# Patient Record
Sex: Female | Born: 2000
Health system: Southern US, Community
[De-identification: ages and names within clinical notes are randomized; demographics above are authoritative.]

## PROBLEM LIST (undated history)

## (undated) DIAGNOSIS — R51 Headache: Secondary | ICD-10-CM

## (undated) DIAGNOSIS — T7840XA Allergy, unspecified, initial encounter: Secondary | ICD-10-CM

## (undated) DIAGNOSIS — J189 Pneumonia, unspecified organism: Secondary | ICD-10-CM

## (undated) HISTORY — DX: Allergy, unspecified, initial encounter: T78.40XA

## (undated) HISTORY — DX: Headache: R51

## (undated) HISTORY — DX: Pneumonia, unspecified organism: J18.9

---
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
## 2020-01-04 ENCOUNTER — Other Ambulatory Visit: Payer: Self-pay | Admitting: Family

## 2020-01-04 DIAGNOSIS — Z975 Presence of (intrauterine) contraceptive device: Secondary | ICD-10-CM

## 2020-01-04 DIAGNOSIS — N921 Excessive and frequent menstruation with irregular cycle: Secondary | ICD-10-CM

## 2020-01-04 MED ORDER — NORETHINDRONE ACET-ETHINYL EST 1.5-30 MG-MCG PO TABS: 1.0000 | ORAL_TABLET | Freq: Every day | ORAL | 3 refills | Status: DC

## 2020-01-14 DIAGNOSIS — F4323 Adjustment disorder with mixed anxiety and depressed mood: Secondary | ICD-10-CM | POA: Diagnosis not present

## 2020-01-14 DIAGNOSIS — F4322 Adjustment disorder with anxiety: Secondary | ICD-10-CM | POA: Diagnosis not present

## 2020-01-22 DIAGNOSIS — F4322 Adjustment disorder with anxiety: Secondary | ICD-10-CM | POA: Diagnosis not present

## 2020-01-22 DIAGNOSIS — F4323 Adjustment disorder with mixed anxiety and depressed mood: Secondary | ICD-10-CM | POA: Diagnosis not present

## 2020-02-19 DIAGNOSIS — F4323 Adjustment disorder with mixed anxiety and depressed mood: Secondary | ICD-10-CM | POA: Diagnosis not present

## 2020-02-19 DIAGNOSIS — F4322 Adjustment disorder with anxiety: Secondary | ICD-10-CM | POA: Diagnosis not present

## 2020-03-30 ENCOUNTER — Other Ambulatory Visit: Payer: Self-pay | Admitting: Family

## 2020-03-30 DIAGNOSIS — N921 Excessive and frequent menstruation with irregular cycle: Secondary | ICD-10-CM

## 2020-03-30 DIAGNOSIS — Z975 Presence of (intrauterine) contraceptive device: Secondary | ICD-10-CM

## 2020-06-21 ENCOUNTER — Other Ambulatory Visit: Payer: Self-pay | Admitting: Pediatrics

## 2020-06-21 DIAGNOSIS — Z975 Presence of (intrauterine) contraceptive device: Secondary | ICD-10-CM

## 2020-09-15 ENCOUNTER — Other Ambulatory Visit: Payer: Self-pay | Admitting: Pediatrics

## 2020-09-15 DIAGNOSIS — N921 Excessive and frequent menstruation with irregular cycle: Secondary | ICD-10-CM

## 2020-10-01 ENCOUNTER — Ambulatory Visit: Payer: BC Managed Care – PPO | Admitting: Pediatrics

## 2020-10-03 ENCOUNTER — Other Ambulatory Visit: Payer: Self-pay

## 2020-10-03 ENCOUNTER — Telehealth: Payer: Self-pay | Admitting: Pediatrics

## 2020-10-03 ENCOUNTER — Ambulatory Visit (INDEPENDENT_AMBULATORY_CARE_PROVIDER_SITE_OTHER): Payer: Medicaid Other | Admitting: Pediatrics

## 2020-10-03 ENCOUNTER — Encounter: Payer: Self-pay | Admitting: Pediatrics

## 2020-10-03 VITALS — BP 120/72 | Ht 60.5 in | Wt 96.2 lb

## 2020-10-03 DIAGNOSIS — R41840 Attention and concentration deficit: Secondary | ICD-10-CM | POA: Diagnosis not present

## 2020-10-03 DIAGNOSIS — Z23 Encounter for immunization: Secondary | ICD-10-CM

## 2020-10-03 DIAGNOSIS — Z0001 Encounter for general adult medical examination with abnormal findings: Secondary | ICD-10-CM | POA: Diagnosis not present

## 2020-10-03 DIAGNOSIS — Z68.41 Body mass index (BMI) pediatric, 5th percentile to less than 85th percentile for age: Secondary | ICD-10-CM

## 2020-10-03 DIAGNOSIS — Z00129 Encounter for routine child health examination without abnormal findings: Secondary | ICD-10-CM

## 2020-10-03 NOTE — Progress Notes (Signed)
Subjective:     Colleen Gordon is an 19 y.o. female who presents for college physical exam. She denies any current medical problems. Questionnaire and forms reviewed with patient and responses verified. Immunizations are up to date. Patient has received 2 doses of MMR vaccine. Varicella immunity: confirmed by vaccine administration.  Concern: Trouble concentrating sometimes, worse at school. Grades are still doing well. Noticed concentration issues in high school, feels they are getting worse now that she's in college. In-person lectures have helped a little with concentration.   The following portions of the patient's history were reviewed and updated as appropriate: allergies, current medications, past family history, past medical history, past social history, past surgical history and problem list.  Review of Systems Pertinent items are noted in HPI.     Objective:    BP 120/72    Ht 5' 0.5" (1.537 m)    Wt 96 lb 3.2 oz (43.6 kg)    BMI 18.48 kg/m  BP 120/72    Ht 5' 0.5" (1.537 m)    Wt 96 lb 3.2 oz (43.6 kg)    BMI 18.48 kg/m  General appearance: alert, cooperative, appears stated age and no distress Head: Normocephalic, without obvious abnormality, atraumatic Eyes: conjunctivae/corneas clear. PERRL, EOM's intact. Fundi benign. Ears: normal TM's and external ear canals both ears Nose: Nares normal. Septum midline. Mucosa normal. No drainage or sinus tenderness. Throat: lips, mucosa, and tongue normal; teeth and gums normal Neck: no adenopathy, no carotid bruit, no JVD, supple, symmetrical, trachea midline and thyroid not enlarged, symmetric, no tenderness/mass/nodules Lungs: clear to auscultation bilaterally Heart: regular rate and rhythm, S1, S2 normal, no murmur, click, rub or gallop and normal apical impulse Abdomen: soft, non-tender; bowel sounds normal; no masses,  no organomegaly Extremities: extremities normal, atraumatic, no cyanosis or edema Skin: Skin color, texture, turgor  normal. No rashes or lesions Lymph nodes: Cervical, supraclavicular, and axillary nodes normal. Neurologic: Grossly normal    Assessment:    Satisfactory college physical exam.   Concern for ADHD- difficulties concentrating  Plan:     1. Reviewed health maintenance, contraception, STDs, and substance abuse. 2. Personal cell [phone] 3.Concern for ADHD, suspect inattentive type. Adolescent medicine will contact to schedule evaluation.  4. HPV and MenB vaccines per orders. Indications, contraindications and side effects of vaccine/vaccines discussed with parent and parent verbally expressed understanding and also agreed with the administration of vaccine/vaccines as ordered above today.Handout (VIS) given for each vaccine at this visit.

## 2020-10-03 NOTE — Patient Instructions (Signed)
Preventive Care 18-19 Years Old, Female Preventive care refers to lifestyle choices and visits with your health care provider that can promote health and wellness. At this stage in your life, you may start seeing a primary care physician instead of a pediatrician. Your health care is now your responsibility. Preventive care for young adults includes:  A yearly physical exam. This is also called an annual wellness visit.  Regular dental and eye exams.  Immunizations.  Screening for certain conditions.  Healthy lifestyle choices, such as diet and exercise. What can I expect for my preventive care visit? Physical exam Your health care provider may check:  Height and weight. These may be used to calculate body mass index (BMI), which is a measurement that tells if you are at a healthy weight.  Heart rate and blood pressure.  Body temperature. Counseling Your health care provider may ask you questions about:  Past medical problems and family medical history.  Alcohol, tobacco, and drug use.  Home and relationship well-being.  Access to firearms.  Emotional well-being.  Diet, exercise, and sleep habits.  Sexual activity and sexual health.  Method of birth control.  Menstrual cycle.  Pregnancy history. What immunizations do I need?  Influenza (flu) vaccine  This is recommended every year. Tetanus, diphtheria, and pertussis (Tdap) vaccine  You may need a Td booster every 10 years. Varicella (chickenpox) vaccine  You may need this vaccine if you have not already been vaccinated. Human papillomavirus (HPV) vaccine  If recommended by your health care provider, you may need three doses over 6 months. Measles, mumps, and rubella (MMR) vaccine  You may need at least one dose of MMR. You may also need a second dose. Meningococcal conjugate (MenACWY) vaccine  One dose is recommended if you are 19-19 years old and a first-year college student living in a residence hall,  or if you have one of several medical conditions. You may also need additional booster doses. Pneumococcal conjugate (PCV13) vaccine  You may need this if you have certain conditions and were not previously vaccinated. Pneumococcal polysaccharide (PPSV23) vaccine  You may need one or two doses if you smoke cigarettes or if you have certain conditions. Hepatitis A vaccine  You may need this if you have certain conditions or if you travel or work in places where you may be exposed to hepatitis A. Hepatitis B vaccine  You may need this if you have certain conditions or if you travel or work in places where you may be exposed to hepatitis B. Haemophilus influenzae type b (Hib) vaccine  You may need this if you have certain risk factors. You may receive vaccines as individual doses or as more than one vaccine together in one shot (combination vaccines). Talk with your health care provider about the risks and benefits of combination vaccines. What tests do I need? Blood tests  Lipid and cholesterol levels. These may be checked every 5 years starting at age 20.  Hepatitis C test.  Hepatitis B test. Screening  Pelvic exam and Pap test. This may be done every 3 years starting at age 19.  Sexually transmitted disease (STD) testing, if you are at risk.  BRCA-related cancer screening. This may be done if you have a family history of breast, ovarian, tubal, or peritoneal cancers. Other tests  Tuberculosis skin test.  Vision and hearing tests.  Skin exam.  Breast exam. Follow these instructions at home: Eating and drinking   Eat a diet that includes fresh fruits and   vegetables, whole grains, lean protein, and low-fat dairy products.  Drink enough fluid to keep your urine pale yellow.  Do not drink alcohol if: ? Your health care provider tells you not to drink. ? You are pregnant, may be pregnant, or are planning to become pregnant. ? You are under the legal drinking age. In the  [place] the legal drinking age is 21.  If you drink alcohol: ? Limit how much you have to 0-1 drink a day. ? Be aware of how much alcohol is in your drink. In the [place] one drink equals one 12 oz bottle of beer (355 mL), one 5 oz glass of wine (148 mL), or one 1 oz glass of hard liquor (44 mL). Lifestyle  Take daily care of your teeth and gums.  Stay active. Exercise at least 30 minutes 5 or more days of the week.  Do not use any products that contain nicotine or tobacco, such as cigarettes, e-cigarettes, and chewing tobacco. If you need help quitting, ask your health care provider.  Do not use drugs.  If you are sexually active, practice safe sex. Use a condom or other form of birth control (contraception) in order to prevent pregnancy and STIs (sexually transmitted infections). If you plan to become pregnant, see your health care provider for a pre-conception visit.  Find healthy ways to cope with stress, such as: ? Meditation, yoga, or listening to music. ? Journaling. ? Talking to a trusted person. ? Spending time with friends and family. Safety  Always wear your seat belt while driving or riding in a vehicle.  Do not drive if you have been drinking alcohol. Do not ride with someone who has been drinking.  Do not drive when you are tired or distracted. Do not text while driving.  Wear a helmet and other protective equipment during sports activities.  If you have firearms in your house, make sure you follow all gun safety procedures.  Seek help if you have been bullied, physically abused, or sexually abused.  Use the Internet responsibly to avoid dangers such as online bullying and online sex predators. What's next?  Go to your health care provider once a year for a well check visit.  Ask your health care provider how often you should have your eyes and teeth checked.  Stay up to date on all vaccines. This information is not intended to replace advice given to you by  your health care provider. Make sure you discuss any questions you have with your health care provider. Document Revised: 12/07/2018 Document Reviewed: 12/07/2018 Elsevier Patient Education  2020 [employer] Inc.  

## 2020-10-03 NOTE — Telephone Encounter (Signed)
Colleen Gordon was in the office this morning for her 19 year well check. During that visit, she was due to MenB vaccine. CMA pulled the pre-filled vaccine from the fridge and administered the vaccine. While entering the vaccine into Epic, CMA realized that HPV vaccine was given to the patient not the MenB vaccine.Provider was notified of error. CMA entered information in [facility] report.   I called Colleen Gordon and spoke with her regarding the error made. Reassured her that the HPV vaccine will not affect her PCOS, Nexplanon. Requested she return to the office at her convenience for MenB vaccine.  Colleen Gordon returned to the office at 12pm for MenB. I personally put her in an exam room and administered the MenB vaccine in her right deltoid. Discussed at length with her the HPV vaccine, benefits and risks, reasons some parents are opposed to the vaccine (sexual activity, social media posts, etc). Colleen Gordon reports that her mom was opposed to the vaccine but she is interested in completing the series. Encouraged Colleen Gordon to make an immunization only visit at checkout for 2 months from now for 2nd HPV vaccine.

## 2020-10-10 NOTE — Telephone Encounter (Addendum)
   Called and left VM to call office to schedule DIVA screening for ADHD eval.  ----- Message from Georges Mouse, NP sent at 10/09/2020  3:09 PM EDT ----- Regarding: RE: schedule follow up Yes, DIVA through [facility] Thanks!  ----- Message ----- From: Debroah Loop, RN Sent: 10/06/2020   8:11 AM EDT To: Georges Mouse, NP Subject: FW: schedule follow up                         [name]- what needs to be scheduled? DIVA? ----- Message ----- From: Georges Mouse, NP Sent: 10/03/2020  12:39 PM EDT To: Verneda Skill, FNP, Debroah Loop, RN Subject: schedule follow up                             Colleen Gordon reached out and asked if we could see her to evaluate for ADHD. Please schedule.

## 2020-10-16 ENCOUNTER — Institutional Professional Consult (permissible substitution): Payer: Medicaid Other | Admitting: Clinical

## 2020-10-20 ENCOUNTER — Other Ambulatory Visit: Payer: Self-pay

## 2020-10-20 ENCOUNTER — Ambulatory Visit (INDEPENDENT_AMBULATORY_CARE_PROVIDER_SITE_OTHER): Payer: Medicaid Other | Admitting: Clinical

## 2020-10-20 DIAGNOSIS — F4322 Adjustment disorder with anxiety: Secondary | ICD-10-CM | POA: Diagnosis not present

## 2020-10-20 NOTE — BH Specialist Note (Signed)
Integrated Behavioral Health Initial Visit  MRN: [id number] Name: Aaliyah Gavel  Number of Integrated Behavioral Health Clinician visits:: 1/6 Session Start time: 1:29 PM   Session End time: 2:30pm Total time: 61 min  Type of Service: Integrated Behavioral Health- Individual/Family Interpretor:No. Interpretor Name and Language: n/a    SUBJECTIVE: Dejanira Pamintuan is a 19 y.o. female accompanied by self Patient was referred by Beatriz Stallion, FNP I[facility] Ilsa Iha, PCP for ADHD symptoms evaluation. Patient reports the following symptoms/concerns: difficulty focusing & completing tasks which is affecting her schooling & daily activities Duration of problem: about 2-3 years; Severity of problem: moderate  OBJECTIVE: Mood: Anxious and Affect: Appropriate Risk of harm to self or others: No plan to harm self or others  LIFE CONTEXT: Family and Social: Moved out of her home School/Work: Working at [employer], Going to college Life Changes: Ongoing adjustment to college & working, also adjustment to [employer] 19 pandemic Strengths: Sense of purpose, Insightful, Social support systems in place  GOALS ADDRESSED: Patient will: 1. Increase knowledge and/or ability of: psycho social factors that may be affecting her ability to focus    INTERVENTIONS: Interventions utilized: Psychoeducation and/or Health Education and Completed Assessment Tool - ADHD DIVA5, reviewed results  Standardized Assessments completed: ADHD DIVA 5  ASSESSMENT: Patient currently experiencing inattentiveness since the end of high school, which has worsened this past year.  Shelene is having difficulties completing tasks for school and daily tasks at home.  Roniya did not report any significant inattentiveness, hyperactivity or impulsivity symptoms before the age of 19 years old.  Breck reported most of the inattentiveness started at the end of high school and she continued to do well in her academics.  Jonette reported significant anxiety  symptoms that mostly started about two years ago.  She did try different medications for it but she had side effects from the Fluoxetine and she tried the escitalopram for less than two weeks before stopping it.   Karington's ability to focus and complete tasks may be more from anxiety and she may benefit from managing her anxiety symptoms.   Erion may benefit from medication management and behavioral strategies for her anxiety symptoms.  PLAN: 1. Follow up with behavioral health clinician on : 10/27/20, joint visit with Candida Peeling, FNP to discuss medication management  2. Behavioral recommendations:  - Practice relaxation or mindfulness strategies to decrease anxiety symptoms - Discuss with FNP medications to manage anxiety symptoms 3. Referral(s): Integrated [facility] (In Clinic) 4. "From scale of 1-10, how likely are you to follow plan?": Carlei was agreeable to plan above  Gordy Savers, LCSW

## 2020-10-27 ENCOUNTER — Ambulatory Visit (INDEPENDENT_AMBULATORY_CARE_PROVIDER_SITE_OTHER): Payer: Medicaid Other | Admitting: Clinical

## 2020-10-27 ENCOUNTER — Ambulatory Visit (INDEPENDENT_AMBULATORY_CARE_PROVIDER_SITE_OTHER): Payer: Medicaid Other | Admitting: Pediatrics

## 2020-10-27 ENCOUNTER — Encounter: Payer: Self-pay | Admitting: Pediatrics

## 2020-10-27 ENCOUNTER — Other Ambulatory Visit: Payer: Self-pay

## 2020-10-27 VITALS — BP 103/70 | HR 69 | Ht 60.63 in | Wt 96.8 lb

## 2020-10-27 DIAGNOSIS — R41840 Attention and concentration deficit: Secondary | ICD-10-CM | POA: Diagnosis not present

## 2020-10-27 DIAGNOSIS — F4323 Adjustment disorder with mixed anxiety and depressed mood: Secondary | ICD-10-CM

## 2020-10-27 DIAGNOSIS — L700 Acne vulgaris: Secondary | ICD-10-CM | POA: Diagnosis not present

## 2020-10-27 MED ORDER — ESCITALOPRAM OXALATE 10 MG PO TABS: 10.0000 mg | ORAL_TABLET | Freq: Every day | ORAL | 3 refills | Status: DC

## 2020-10-27 MED ORDER — BENZACLIN 1-5 % EX GEL: CUTANEOUS | 11 refills | Status: DC

## 2020-10-27 NOTE — BH Specialist Note (Signed)
Integrated Behavioral Health Follow Up Visit  MRN: [id number] Name: Colleen Gordon  Number of Integrated Behavioral Health Clinician visits: 2/6 Session Start time: 9:17 AM   Session End time: 9:50 AM Total time: 33 min  Type of Service: Integrated Behavioral Health- Individual Interpretor:No. Interpretor Name and Language: n/a  SUBJECTIVE: Colleen Gordon is a 19 y.o. female accompanied by self Patient was referred by C. Maxwell Caul, FNP for anxiety symptoms. Patient reports the following symptoms/concerns: ongoing difficulties with focusing & completing tasks, ongoing anxiety symptoms with depressed symptoms Duration of problem: months to years; Severity of problem: moderate  OBJECTIVE: Mood: Anxious and Depressed and Affect: Appropriate Risk of harm to self or others: No plan to harm self or others   GOALS ADDRESSED: Patient will: 1.  Increase knowledge and/or ability of: coping skills and ability to focus as evidenced by self-report    INTERVENTIONS: Interventions utilized:  Mindfulness or Management consultant and Psychoeducation and/or Health Education Standardized Assessments completed: PHQ-SADS   PHQ-SADS Last 3 Score only 10/27/2020 10/20/2020 10/03/2020  PHQ-15 Score 6 5 -  Total GAD-7 Score 12 13 -  PHQ-9 Total Score 13 12 12      ASSESSMENT: Patient currently experiencing ongoing anxiety & depressive symptoms which appears to be affecting her ability to focus and complete tasks.   Colleen Gordon also has joint visit with Colleen Sago, FNP today and will begin medication management for her symptoms.  Colleen Gordon also willing to practice relaxation/mindfulness strategies that can improve her health & ability to focus.  Patient may benefit from practicing mindfulness/relaxation strategies and consistently taking medication as prescribed by C. Colleen Sago, FNP.  PLAN: 1. Follow up with behavioral health clinician on : 11/10/20 2. Behavioral recommendations:  - Practice mindfulness/relaxation  strategies - Take medication as prescribed every day  Colleen Gordon agreeable to plan above.  Colleen Gordon Colleen Sago, LCSW

## 2020-10-27 NOTE — Progress Notes (Signed)
History was provided by the patient.  Colleen Gordon is a 19 y.o. female who is here for mood symptoms, BTB with nexplanon.  Colleen June, NP   HPI:  Pt reports some sx that she feels may be related to ADHD, but DIVA completed today with [name] who feels her inattention is likely anxiety related. She has tried fluoxetine for less than two weeks. Took lexapro less than two weeks as well- no side effects but did not continue. Dad has anxiety and was on meds- she thinks he was also bipolar.   Periods have been fine. Taking OCP with nexplanon. Has been using tretinoin cream for break outs on chin and cheeks.   No LMP recorded. (Menstrual status: Irregular Periods).  Review of Systems  Constitutional: Negative for malaise/fatigue.  Eyes: Negative for double vision.  Respiratory: Negative for shortness of breath.   Cardiovascular: Negative for chest pain and palpitations.  Gastrointestinal: Negative for abdominal pain, constipation, diarrhea, nausea and vomiting.  Genitourinary: Negative for dysuria.  Musculoskeletal: Negative for joint pain and myalgias.  Skin: Negative for rash.  Neurological: Negative for dizziness and headaches.  Endo/Heme/Allergies: Does not bruise/bleed easily.  Psychiatric/Behavioral: Negative for depression and suicidal ideas. The patient is nervous/anxious.     Patient Active Problem List   Diagnosis Date Noted  . Difficulty concentrating 10/03/2020  . Adjustment disorder with mixed anxiety and depressed mood 11/15/2018  . Breakthrough bleeding on Nexplanon 11/15/2018  . Acne vulgaris 11/15/2018  . PCOS (polycystic ovarian syndrome) 10/31/2018  . Irregular periods/menstrual cycles 07/17/2018  . Encounter for routine child health examination without abnormal findings 08/10/2017  . BMI (body mass index), pediatric, 5% to less than 85% for age 18/15/2018    Current Outpatient Medications on File Prior to Visit  Medication Sig Dispense Refill  . JUNEL 1.5/30 1.5-30  MG-MCG tablet TAKE 1 TABLET BY MOUTH EVERY DAY 21 tablet 3  . tretinoin (RETIN-A) 0.05 % cream Apply topically at bedtime. 45 g 6   No current facility-administered medications on file prior to visit.    No Known Allergies   Physical Exam:    Vitals:   10/27/20 0911  BP: 103/70  Pulse: 69  Weight: 96 lb 12.8 oz (43.9 kg)  Height: 5' 0.63" (1.54 m)    Blood pressure percentiles are not available for patients who are 18 years or older.  Physical Exam Vitals and nursing note reviewed.  Constitutional:      General: She is not in acute distress.    Appearance: She is well-developed.  Neck:     Thyroid: No thyromegaly.  Cardiovascular:     Rate and Rhythm: Normal rate and regular rhythm.     Heart sounds: No murmur heard.   Pulmonary:     Breath sounds: Normal breath sounds.  Abdominal:     Palpations: Abdomen is soft. There is no mass.     Tenderness: There is no abdominal tenderness. There is no guarding.  Musculoskeletal:     Right lower leg: No edema.     Left lower leg: No edema.  Lymphadenopathy:     Cervical: No cervical adenopathy.  Skin:    General: Skin is warm.     Findings: No rash.     Comments: Small pustular acne on chin and cheeks   Neurological:     Mental Status: She is alert.     Comments: No tremor  Psychiatric:        Mood and Affect: Mood normal.  Assessment/Plan: 1. Adjustment disorder with mixed anxiety and depressed mood Will restart lexapro. No issues with it previously, just didn't take it long enough previously to see benefit. Meeting with [facility] today.  - escitalopram (LEXAPRO) 10 MG tablet; Take 1 tablet (10 mg total) by mouth daily.  Dispense: 30 tablet; Refill: 3  2. Difficulty concentrating Did not meet criteria for ADHD today- [facility] assessment is that anxiety is the likely driving factor which I would agree with.   3. Acne vulgaris Will add benzaclin in the AM. Continue retin a in the PM  - BENZACLIN gel; Apply topically every  morning.  Dispense: 25 g; Refill: 11  Return 2 weeks with [facility], 4 weeks with me.   Alfonso Ramus, FNP

## 2020-11-10 ENCOUNTER — Other Ambulatory Visit: Payer: Self-pay

## 2020-11-10 ENCOUNTER — Ambulatory Visit (INDEPENDENT_AMBULATORY_CARE_PROVIDER_SITE_OTHER): Payer: Medicaid Other | Admitting: Clinical

## 2020-11-10 DIAGNOSIS — F4323 Adjustment disorder with mixed anxiety and depressed mood: Secondary | ICD-10-CM | POA: Diagnosis not present

## 2020-11-10 NOTE — BH Specialist Note (Signed)
Integrated Behavioral Health Follow Up Visit  MRN: [id number] Name: Colleen Gordon  Number of Integrated Behavioral Health Clinician visits: 2/6 Session Start time: 9:11 AM Session End time: 9:28 AM Total time: 17 min  Type of Service: Integrated Behavioral Health- Individual/Family Interpretor:No. Interpretor Name and Language: n/a  SUBJECTIVE: Colleen Gordon is a 19 y.o. female accompanied by self Patient was referred by Adolescent Medicine Team for anxiety & med. monitoring. Patient reports the following symptoms/concerns: some side effects with medication, mostly with drowsiness during the day Duration of problem: days; Severity of problem: mild  OBJECTIVE: Mood: Anxious and Depressed and Affect: Appropriate Risk of harm to self or others: No plan to harm self or others  GOALS ADDRESSED: Patient will: 1.  Increase knowledge and/or ability of: coping skills and ability to focus as evidenced by self-report   INTERVENTIONS: Interventions utilized:  Mindfulness or Relaxation Training and Medication Monitoring - Review of mindfulness & setting goal to complete it daily Standardized Assessments completed: Not Needed    Med Monitoring: Initial tingling-some tremors- taking it with food now and no tingling or tremors Drowsiness- during the day - taking naps now - every day with it (getting behind in school work because of nap) Appetite - decreased  Current Outpatient Medications on File Prior to Visit  Medication Sig Dispense Refill  . BENZACLIN gel Apply topically every morning. 25 g 11  . escitalopram (LEXAPRO) 10 MG tablet Take 1 tablet (10 mg total) by mouth daily. 30 tablet 3  . JUNEL 1.5/30 1.5-30 MG-MCG tablet TAKE 1 TABLET BY MOUTH EVERY DAY 21 tablet 3  . tretinoin (RETIN-A) 0.05 % cream Apply topically at bedtime. 45 g 6   No current facility-administered medications on file prior to visit.     ASSESSMENT: Patient currently experiencing drowsiness during the day since  taking the lexapro.  Colleen Gordon has had to take to daily naps which has led her to get behind with her school work. Colleen Gordon would like to take the lexapro at night so she's not drowsy during the day.  Colleen Gordon reported she's been able to practice some of the grounding & mindfulness techniques in the last couple weeks.  She wants to work on doing it every day.  Patient may benefit from practicing the mindfulness & deep breathing skills each day.  PLAN: 2. Follow up with behavioral health clinician on : 12/08/20 3. Behavioral recommendations:  - Deep breathing - daily - Mindfulness moment - daily (walking slowly, eating) - Continue to take medications daily - will switch to 7pm since it's making her feel drowsy  4. "From scale of 1-10, how likely are you to follow plan?": Colleen Gordon agreed to plan above  Gordy Savers, LCSW

## 2020-11-10 NOTE — Patient Instructions (Addendum)
Thank you for coming in today!  Great job practicing your strategies.  Continue to practice them!   - Deep breathing - daily - Mindfulness moment - daily (walking slowly, eating) - Continue to take medications daily - will switch to 7pm

## 2020-11-24 ENCOUNTER — Ambulatory Visit (INDEPENDENT_AMBULATORY_CARE_PROVIDER_SITE_OTHER): Payer: Medicaid Other | Admitting: Pediatrics

## 2020-11-24 ENCOUNTER — Other Ambulatory Visit: Payer: Self-pay

## 2020-11-24 VITALS — BP 102/74 | HR 76 | Ht 60.63 in | Wt 98.0 lb

## 2020-11-24 DIAGNOSIS — R41840 Attention and concentration deficit: Secondary | ICD-10-CM

## 2020-11-24 DIAGNOSIS — F4323 Adjustment disorder with mixed anxiety and depressed mood: Secondary | ICD-10-CM | POA: Diagnosis not present

## 2020-11-24 DIAGNOSIS — G479 Sleep disorder, unspecified: Secondary | ICD-10-CM | POA: Diagnosis not present

## 2020-11-24 NOTE — Progress Notes (Signed)
History was provided by the patient.  Colleen Gordon is a 19 y.o. female who is here for med follow up.  [name], NP   HPI:  Pt reports that she worked on switching the lexapro to the PM but she kept falling asleep so she switched it back to the AM. Her sleepiness isn't as bad, but still some. Has been consistent but does miss a few doses. She has been feeling more calm, but has been having restless legs at night. Sometimes wakes up in the middle night d/t it.   Last week of school an finals coming up. Giving boosters at pharmacy.    Has been having some BTB with her period so sometimes she misses doses. Not currently sexually active.   No LMP recorded. (Menstrual status: Irregular Periods).  Review of Systems  Constitutional: Negative for malaise/fatigue.  Eyes: Negative for double vision.  Respiratory: Negative for shortness of breath.   Cardiovascular: Negative for chest pain and palpitations.  Gastrointestinal: Negative for abdominal pain, constipation, diarrhea, nausea and vomiting.  Genitourinary: Negative for dysuria.  Musculoskeletal: Negative for joint pain and myalgias.  Skin: Negative for rash.  Neurological: Negative for dizziness and headaches.  Endo/Heme/Allergies: Does not bruise/bleed easily.    Patient Active Problem List   Diagnosis Date Noted  . Difficulty concentrating 10/03/2020  . Adjustment disorder with mixed anxiety and depressed mood 11/15/2018  . Breakthrough bleeding on Nexplanon 11/15/2018  . Acne vulgaris 11/15/2018  . PCOS (polycystic ovarian syndrome) 10/31/2018  . Irregular periods/menstrual cycles 07/17/2018  . Encounter for routine child health examination without abnormal findings 08/10/2017  . BMI (body mass index), pediatric, 5% to less than 85% for age 62/15/2018    Current Outpatient Medications on File Prior to Visit  Medication Sig Dispense Refill  . BENZACLIN gel Apply topically every morning. 25 g 11  . escitalopram (LEXAPRO) 10  MG tablet Take 1 tablet (10 mg total) by mouth daily. 30 tablet 3  . JUNEL 1.5/30 1.5-30 MG-MCG tablet TAKE 1 TABLET BY MOUTH EVERY DAY 21 tablet 3  . tretinoin (RETIN-A) 0.05 % cream Apply topically at bedtime. 45 g 6   No current facility-administered medications on file prior to visit.    No Known Allergies   Physical Exam:    Vitals:   11/24/20 0937  BP: 102/74  Pulse: 76  Weight: 98 lb (44.5 kg)  Height: 5' 0.63" (1.54 m)    Blood pressure percentiles are not available for patients who are 18 years or older.  Physical Exam Vitals and nursing note reviewed.  Constitutional:      General: She is not in acute distress.    Appearance: She is well-developed.  Neck:     Thyroid: No thyromegaly.  Cardiovascular:     Rate and Rhythm: Normal rate and regular rhythm.     Heart sounds: No murmur heard.   Pulmonary:     Breath sounds: Normal breath sounds.  Abdominal:     Palpations: Abdomen is soft. There is no mass.     Tenderness: There is no abdominal tenderness. There is no guarding.  Musculoskeletal:     Right lower leg: No edema.     Left lower leg: No edema.  Lymphadenopathy:     Cervical: No cervical adenopathy.  Skin:    General: Skin is warm.     Findings: No rash.  Neurological:     Mental Status: She is alert.     Comments: No tremor  Psychiatric:  Mood and Affect: Mood normal.        Behavior: Behavior normal.     Assessment/Plan: 1. Adjustment disorder with mixed anxiety and depressed mood lexapro 10 mg. Still working to take it regularly. Given the option to continue this med vs. Switch to something else, she opted to continue for the next 4-6 weeks with the understanding she can send me a mychart if she wishes to switch before then.   2. Difficulty concentrating Will monitor as anxiety and depression sx improve.   3. Sleep disturbance R/t medication side effect- discussed strategies and monitoring for improvement.   Return in 6 weeks or  sooner as needed   Alfonso Ramus, FNP

## 2020-12-07 ENCOUNTER — Other Ambulatory Visit: Payer: Self-pay | Admitting: Pediatrics

## 2020-12-07 DIAGNOSIS — Z975 Presence of (intrauterine) contraceptive device: Secondary | ICD-10-CM

## 2020-12-08 ENCOUNTER — Ambulatory Visit: Payer: Medicaid Other | Admitting: Clinical

## 2020-12-08 NOTE — BH Specialist Note (Signed)
Integrated Behavioral Health via Telemedicine Visit  12/08/2020 Anaisa Radi [id number]  Sent link to phone & email. Left voicemail on phone & sent MyChart re: appt & to reschedule.  Number of Integrated Behavioral Health visits: 3  Unable to connect today.  Roann Merk Ed Blalock, LCSW

## 2020-12-10 ENCOUNTER — Ambulatory Visit: Payer: Medicaid Other | Admitting: Pediatrics

## 2020-12-12 ENCOUNTER — Encounter: Payer: Self-pay | Admitting: Pediatrics

## 2020-12-12 ENCOUNTER — Ambulatory Visit (INDEPENDENT_AMBULATORY_CARE_PROVIDER_SITE_OTHER): Payer: Medicaid Other | Admitting: Pediatrics

## 2020-12-12 ENCOUNTER — Other Ambulatory Visit: Payer: Self-pay

## 2020-12-12 DIAGNOSIS — Z23 Encounter for immunization: Secondary | ICD-10-CM

## 2020-12-12 NOTE — Progress Notes (Signed)
HPV vaccine per orders. Indications, contraindications and side effects of vaccine/vaccines discussed with parent and parent verbally expressed understanding and also agreed with the administration of vaccine/vaccines as ordered above today.Handout (VIS) given for each vaccine at this visit.  

## 2020-12-15 ENCOUNTER — Ambulatory Visit: Payer: Medicaid Other | Admitting: Clinical

## 2020-12-15 NOTE — BH Specialist Note (Signed)
Integrated Behavioral Health via Telemedicine Visit  12/15/2020 Aleane Wesenberg [id number]  10:55am Sent video link 10:58 am TC to pt and left message to call back or get on the video link  Number of Integrated Behavioral Health visits: 3 [address], LCSW

## 2021-01-05 ENCOUNTER — Other Ambulatory Visit (HOSPITAL_COMMUNITY)
Admission: RE | Admit: 2021-01-05 | Discharge: 2021-01-05 | Disposition: A | Discharge status: Home / Self Care | Payer: Medicaid Other | Source: Ambulatory Visit | Attending: Pediatrics | Admitting: Pediatrics

## 2021-01-05 ENCOUNTER — Other Ambulatory Visit: Payer: Self-pay

## 2021-01-05 ENCOUNTER — Ambulatory Visit (INDEPENDENT_AMBULATORY_CARE_PROVIDER_SITE_OTHER): Payer: Medicaid Other | Admitting: Pediatrics

## 2021-01-05 VITALS — BP 112/76 | HR 85 | Ht 60.63 in | Wt 99.0 lb

## 2021-01-05 DIAGNOSIS — Z113 Encounter for screening for infections with a predominantly sexual mode of transmission: Secondary | ICD-10-CM

## 2021-01-05 DIAGNOSIS — G479 Sleep disorder, unspecified: Secondary | ICD-10-CM

## 2021-01-05 DIAGNOSIS — F4323 Adjustment disorder with mixed anxiety and depressed mood: Secondary | ICD-10-CM

## 2021-01-05 DIAGNOSIS — R41840 Attention and concentration deficit: Secondary | ICD-10-CM

## 2021-01-05 MED ORDER — DULOXETINE HCL 20 MG PO CPEP
20.0000 mg | ORAL_CAPSULE | Freq: Every day | ORAL | 3 refills | Status: DC
Start: 2021-01-05 — End: 2021-03-03

## 2021-01-05 NOTE — Patient Instructions (Addendum)
Week 1: Decrease lexapro to 5 mg daily. Start cymbalta 20 mg daily  Week 2: Stop lexapro. Continue cymbalta 20 mg daily   We will check iron and vitamin d today. I will send results on mychart. If you have questions or concerns about the new medication, please send a mychart message!

## 2021-01-05 NOTE — Progress Notes (Signed)
History was provided by the patient.  Colleen Gordon is a 20 y.o. female who is here for anxiety, concentration difficulty and sleep disturbance.  [name], NP   HPI:  Pt reports that she has not had considerable improvement with increase in lexapro. Still having some difficult with concentration and ongoing issues with anxiety. She is interested in medication change today.   Continues to have some restless leg in the PM, but thinks it is related to medication.    Doing well with OCP and acne has continued to be well managed s/p accutane.   No LMP recorded. (Menstrual status: Irregular Periods).  Review of Systems  Constitutional: Negative for malaise/fatigue.  Eyes: Negative for double vision.  Respiratory: Negative for shortness of breath.   Cardiovascular: Negative for chest pain and palpitations.  Gastrointestinal: Negative for abdominal pain, constipation, diarrhea, nausea and vomiting.  Genitourinary: Negative for dysuria.  Musculoskeletal: Negative for joint pain and myalgias.  Skin: Negative for rash.  Neurological: Negative for dizziness and headaches.  Endo/Heme/Allergies: Does not bruise/bleed easily.  Psychiatric/Behavioral: Negative for depression. The patient is nervous/anxious and has insomnia.     Patient Active Problem List   Diagnosis Date Noted  . Sleep disturbance 11/24/2020  . Difficulty concentrating 10/03/2020  . Adjustment disorder with mixed anxiety and depressed mood 11/15/2018  . Breakthrough bleeding on Nexplanon 11/15/2018  . Acne vulgaris 11/15/2018  . PCOS (polycystic ovarian syndrome) 10/31/2018  . Irregular periods/menstrual cycles 07/17/2018  . Encounter for routine child health examination without abnormal findings 08/10/2017  . BMI (body mass index), pediatric, 5% to less than 85% for age 82/15/2018    Current Outpatient Medications on File Prior to Visit  Medication Sig Dispense Refill  . BENZACLIN gel Apply topically every morning. 25  g 11  . escitalopram (LEXAPRO) 10 MG tablet Take 1 tablet (10 mg total) by mouth daily. 30 tablet 3  . JUNEL 1.5/30 1.5-30 MG-MCG tablet TAKE 1 TABLET BY MOUTH EVERY DAY 21 tablet 3  . tretinoin (RETIN-A) 0.05 % cream Apply topically at bedtime. 45 g 6   No current facility-administered medications on file prior to visit.    No Known Allergies   Physical Exam:    Vitals:   01/05/21 0840  BP: 112/76  Pulse: 85  Weight: 99 lb (44.9 kg)  Height: 5' 0.63" (1.54 m)    Blood pressure percentiles are not available for patients who are 18 years or older.  Physical Exam Vitals and nursing note reviewed.  Constitutional:      General: She is not in acute distress.    Appearance: She is well-developed.  Neck:     Thyroid: No thyromegaly.  Cardiovascular:     Rate and Rhythm: Normal rate and regular rhythm.     Heart sounds: No murmur heard.   Pulmonary:     Breath sounds: Normal breath sounds.  Abdominal:     Palpations: Abdomen is soft. There is no mass.     Tenderness: There is no abdominal tenderness. There is no guarding.  Musculoskeletal:     Right lower leg: No edema.     Left lower leg: No edema.  Lymphadenopathy:     Cervical: No cervical adenopathy.  Skin:    General: Skin is warm.     Findings: No rash.  Neurological:     Mental Status: She is alert.     Comments: No tremor  Psychiatric:        Mood and Affect: Mood normal.  Behavior: Behavior normal.     Assessment/Plan: 1. Adjustment disorder with mixed anxiety and depressed mood Will check vit d and ferritin. Will taper lexapro and start cymbalta.  - VITAMIN D 25 Hydroxy (Vit-D Deficiency, Fractures) - DULoxetine (CYMBALTA) 20 MG capsule; Take 1 capsule (20 mg total) by mouth daily.  Dispense: 30 capsule; Refill: 3  2. Difficulty concentrating May have some improvement in concentration with cymbalta. Did not have testing that was consistent with ADHD.   3. Sleep disturbance Will look for  improvement with change in medication.  - Ferritin - VITAMIN D 25 Hydroxy (Vit-D Deficiency, Fractures)  4. Routine screening for STI (sexually transmitted infection) Per protocol.  - Urine cytology ancillary only - HIV Antibody (routine testing w rflx)   Return in 2 weeks   Alfonso Ramus, FNP

## 2021-01-06 LAB — FERRITIN: Ferritin: 40 ng/mL (ref 16–154)

## 2021-01-06 LAB — VITAMIN D 25 HYDROXY (VIT D DEFICIENCY, FRACTURES): Vit D, 25-Hydroxy: 18 ng/mL — ABNORMAL LOW (ref 30–100)

## 2021-01-06 LAB — HIV ANTIBODY (ROUTINE TESTING W REFLEX): HIV 1&2 Ab, 4th Generation: NONREACTIVE

## 2021-01-08 LAB — URINE CYTOLOGY ANCILLARY ONLY
Chlamydia: NEGATIVE
Comment: NEGATIVE
Comment: NEGATIVE
Comment: NORMAL
Neisseria Gonorrhea: NEGATIVE
Trichomonas: NEGATIVE

## 2021-01-23 ENCOUNTER — Other Ambulatory Visit: Payer: Self-pay | Admitting: Family

## 2021-01-23 MED ORDER — VITAMIN D (ERGOCALCIFEROL) 1.25 MG (50000 UNIT) PO CAPS: 50000.0000 [IU] | ORAL_CAPSULE | ORAL | 0 refills | Status: DC

## 2021-01-26 ENCOUNTER — Telehealth (INDEPENDENT_AMBULATORY_CARE_PROVIDER_SITE_OTHER): Payer: Medicaid Other | Admitting: Pediatrics

## 2021-01-26 DIAGNOSIS — R41840 Attention and concentration deficit: Secondary | ICD-10-CM

## 2021-01-26 DIAGNOSIS — F4323 Adjustment disorder with mixed anxiety and depressed mood: Secondary | ICD-10-CM | POA: Diagnosis not present

## 2021-01-26 DIAGNOSIS — G479 Sleep disorder, unspecified: Secondary | ICD-10-CM

## 2021-01-26 NOTE — Progress Notes (Signed)
THIS RECORD MAY CONTAIN CONFIDENTIAL INFORMATION THAT SHOULD NOT BE RELEASED WITHOUT REVIEW OF THE SERVICE PROVIDER.  Virtual Follow-Up Visit via Video Note  I connected with Colleen Gordon 's patient  on 01/26/21 at 10:00 AM EST by a video enabled telemedicine application and verified that I am speaking with the correct person using two identifiers.   Patient/parent location: Home   I discussed the limitations of evaluation and management by telemedicine and the availability of in person appointments.  I discussed that the purpose of this telehealth visit is to provide medical care while limiting exposure to the novel coronavirus.  The patient expressed understanding and agreed to proceed.   Colleen Gordon is a 20 y.o. female referred by [name], NP here today for follow-up of adjustment disorder, concentration difficulty and sleep disturbance.  Previsit planning completed:  yes   History was provided by the patient.  Plan from Last Visit:   Switch to duloxetine   Chief Complaint: Med f/u  History of Present Illness:  During second week of wean she would wake up in the middle of the night with legs hurting, but now she is not having that problem. She also started vit D supplement and has taken 1 high dose so far.   With change to duloxetine she thinks things are a little better. Had an initial loss of appetite. A few nights she had a resting HR that was elevated, but this has stopped. She was not anxious in the moment.   Anxiety 4/10, prior was 6-7/10.  Depression- none recently  Focus- feels some better    Review of Systems  Constitutional: Negative for malaise/fatigue.  Eyes: Negative for double vision.  Respiratory: Negative for shortness of breath.   Cardiovascular: Negative for chest pain and palpitations.  Gastrointestinal: Negative for abdominal pain, constipation, diarrhea, nausea and vomiting.  Genitourinary: Negative for dysuria.  Musculoskeletal: Negative for joint  pain and myalgias.  Skin: Negative for rash.  Neurological: Negative for dizziness and headaches.  Endo/Heme/Allergies: Does not bruise/bleed easily.  Psychiatric/Behavioral: Negative for depression. The patient is nervous/anxious and has insomnia.      No Known Allergies Outpatient Medications Prior to Visit  Medication Sig Dispense Refill  . BENZACLIN gel Apply topically every morning. 25 g 11  . DULoxetine (CYMBALTA) 20 MG capsule Take 1 capsule (20 mg total) by mouth daily. 30 capsule 3  . JUNEL 1.5/30 1.5-30 MG-MCG tablet TAKE 1 TABLET BY MOUTH EVERY DAY 21 tablet 3  . tretinoin (RETIN-A) 0.05 % cream Apply topically at bedtime. 45 g 6  . Vitamin D, Ergocalciferol, (DRISDOL) 1.25 MG (50000 UNIT) CAPS capsule Take 1 capsule (50,000 Units total) by mouth every 7 (seven) days. 8 capsule 0   No facility-administered medications prior to visit.     Patient Active Problem List   Diagnosis Date Noted  . Sleep disturbance 11/24/2020  . Difficulty concentrating 10/03/2020  . Adjustment disorder with mixed anxiety and depressed mood 11/15/2018  . Breakthrough bleeding on Nexplanon 11/15/2018  . Acne vulgaris 11/15/2018  . PCOS (polycystic ovarian syndrome) 10/31/2018  . Irregular periods/menstrual cycles 07/17/2018  . Encounter for routine child health examination without abnormal findings 08/10/2017  . BMI (body mass index), pediatric, 5% to less than 85% for age 59/15/2018    The following portions of the patient's history were reviewed and updated as appropriate: allergies, current medications, past family history, past medical history, past social history, past surgical history and problem list.  Visual Observations/Objective:  General Appearance: Well nourished well developed, in no apparent distress.  Eyes: conjunctiva no swelling or erythema ENT/Mouth: No hoarseness, No cough for duration of visit.  Neck: Supple  Respiratory: Respiratory effort normal, normal rate, no  retractions or distress.   Cardio: Appears well-perfused, noncyanotic Musculoskeletal: no obvious deformity Skin: visible skin without rashes, ecchymosis, erythema Neuro: Awake and oriented X 3,  Psych:  normal affect, Insight and Judgment appropriate.    Assessment/Plan: 1. Adjustment disorder with mixed anxiety and depressed mood No recent depression sx. Concentration and anxiety are improving with medication change. No major side effects. Continue duloxetine 20 mg.   2. Difficulty concentrating Improving with improving anxiety.   3. Sleep disturbance Improved since med change and start of vit D. Will continue to monitor.     I discussed the assessment and treatment plan with the patient and/or parent/guardian.  They were provided an opportunity to ask questions and all were answered.  They agreed with the plan and demonstrated an understanding of the instructions. They were advised to call back or seek an in-person evaluation in the emergency room if the symptoms worsen or if the condition fails to improve as anticipated.   Follow-up:   4 weeks or sooner as needed   Medical decision-making:   I spent 15 minutes on this telehealth visit inclusive of face-to-face video and care coordination time I was located in clinic during this encounter.   Alfonso Ramus, FNP    CC: Janene Harvey, Pascal Lux, NP, Janene Harvey, Pascal Lux, NP

## 2021-02-05 DIAGNOSIS — F411 Generalized anxiety disorder: Secondary | ICD-10-CM | POA: Diagnosis not present

## 2021-02-05 DIAGNOSIS — F331 Major depressive disorder, recurrent, moderate: Secondary | ICD-10-CM | POA: Diagnosis not present

## 2021-02-19 DIAGNOSIS — F411 Generalized anxiety disorder: Secondary | ICD-10-CM | POA: Diagnosis not present

## 2021-02-19 DIAGNOSIS — F331 Major depressive disorder, recurrent, moderate: Secondary | ICD-10-CM | POA: Diagnosis not present

## 2021-02-25 DIAGNOSIS — F331 Major depressive disorder, recurrent, moderate: Secondary | ICD-10-CM | POA: Diagnosis not present

## 2021-02-25 DIAGNOSIS — F411 Generalized anxiety disorder: Secondary | ICD-10-CM | POA: Diagnosis not present

## 2021-03-03 ENCOUNTER — Telehealth (INDEPENDENT_AMBULATORY_CARE_PROVIDER_SITE_OTHER): Payer: Medicaid Other | Admitting: Pediatrics

## 2021-03-03 DIAGNOSIS — F4323 Adjustment disorder with mixed anxiety and depressed mood: Secondary | ICD-10-CM | POA: Diagnosis not present

## 2021-03-03 DIAGNOSIS — G479 Sleep disorder, unspecified: Secondary | ICD-10-CM | POA: Diagnosis not present

## 2021-03-03 DIAGNOSIS — R41840 Attention and concentration deficit: Secondary | ICD-10-CM | POA: Diagnosis not present

## 2021-03-03 MED ORDER — DULOXETINE HCL 30 MG PO CPEP: 30.0000 mg | ORAL_CAPSULE | Freq: Every day | ORAL | 3 refills | Status: DC

## 2021-03-03 NOTE — Progress Notes (Signed)
THIS RECORD MAY CONTAIN CONFIDENTIAL INFORMATION THAT SHOULD NOT BE RELEASED WITHOUT REVIEW OF THE SERVICE PROVIDER.  Virtual Follow-Up Visit via Video Note  I connected with Colleen Gordon 's patient  on 03/03/21 at  8:30 AM EST by a video enabled telemedicine application and verified that I am speaking with the correct person using two identifiers.   Patient/parent location: Home   I discussed the limitations of evaluation and management by telemedicine and the availability of in person appointments.  I discussed that the purpose of this telehealth visit is to provide medical care while limiting exposure to the novel coronavirus.  The patient expressed understanding and agreed to proceed.   Colleen Gordon is a 20 y.o. female referred by Colleen June, NP here today for follow-up of anxiety, depression, inattention and sleep concerns.  Previsit planning completed:  yes   History was provided by the patient.  Plan from Last Visit:   Continue duloxetine 20 mg daily   Chief Complaint: Med f/u  History of Present Illness:  Colleen Gordon on a trip the last few days and was nauseous- wasn't sure if it was anxiety or just coincidental. Anxiety is around 5-6/10 these days. Denies depression symptoms, feels like she is more alert and attentive.   She has been sleeping much better with medication switch and taking vitamin d.   She is still in school and doing well at [employer]. Trip this weekend was with the pep band for men's BB tournament.   No SI/HI.   No Known Allergies Outpatient Medications Prior to Visit  Medication Sig Dispense Refill  . BENZACLIN gel Apply topically every morning. 25 g 11  . JUNEL 1.5/30 1.5-30 MG-MCG tablet TAKE 1 TABLET BY MOUTH EVERY DAY 21 tablet 3  . tretinoin (RETIN-A) 0.05 % cream Apply topically at bedtime. 45 g 6  . Vitamin D, Ergocalciferol, (DRISDOL) 1.25 MG (50000 UNIT) CAPS capsule Take 1 capsule (50,000 Units total) by mouth every 7 (seven) days. 8 capsule 0  .  DULoxetine (CYMBALTA) 20 MG capsule Take 1 capsule (20 mg total) by mouth daily. 30 capsule 3   No facility-administered medications prior to visit.     Patient Active Problem List   Diagnosis Date Noted  . Sleep disturbance 11/24/2020  . Difficulty concentrating 10/03/2020  . Adjustment disorder with mixed anxiety and depressed mood 11/15/2018  . Breakthrough bleeding on Nexplanon 11/15/2018  . Acne vulgaris 11/15/2018  . PCOS (polycystic ovarian syndrome) 10/31/2018  . Irregular periods/menstrual cycles 07/17/2018  . Encounter for routine child health examination without abnormal findings 08/10/2017  . BMI (body mass index), pediatric, 5% to less than 85% for age 68/15/2018    The following portions of the patient's history were reviewed and updated as appropriate: allergies, current medications, past family history, past medical history, past social history, past surgical history and problem list.  Visual Observations/Objective:   General Appearance: Well nourished well developed, in no apparent distress.  Eyes: conjunctiva no swelling or erythema ENT/Mouth: No hoarseness, No cough for duration of visit.  Neck: Supple  Respiratory: Respiratory effort normal, normal rate, no retractions or distress.   Cardio: Appears well-perfused, noncyanotic Musculoskeletal: no obvious deformity Skin: visible skin without rashes, ecchymosis, erythema Neuro: Awake and oriented X 3,  Psych:  normal affect, Insight and Judgment appropriate.    Assessment/Plan: 1. Adjustment disorder with mixed anxiety and depressed mood Will increase duloxetine to 30 mg daily. She will let me know in 2 weeks by mychart how it is  going- if little improvement we will increase to 40 mg daily and hold there. Good improvement in PHQ9.  - DULoxetine (CYMBALTA) 30 MG capsule; Take 1 capsule (30 mg total) by mouth daily.  Dispense: 30 capsule; Refill: 3  2. Sleep disturbance Improved with changes to medication.    3. Difficulty concentrating Improving with duloxetine.    BH screenings:  PHQ-SADS Last 3 Score only 03/03/2021 10/27/2020 10/20/2020  PHQ-15 Score 4 6 5   Total GAD-7 Score 10 12 13   PHQ-9 Total Score 5 13 12     Screens discussed with patient and parent and adjustments to plan made accordingly.   I discussed the assessment and treatment plan with the patient and/or parent/guardian.  They were provided an opportunity to ask questions and all were answered.  They agreed with the plan and demonstrated an understanding of the instructions. They were advised to call back or seek an in-person evaluation in the emergency room if the symptoms worsen or if the condition fails to improve as anticipated.   Follow-up:  5 weeks via video, 2 weeks via mychart   Medical decision-making:   I spent 15 minutes on this telehealth visit inclusive of face-to-face video and care coordination time I was located in clinic during this encounter.   , FNP    CC: , , NP, Colleen Gordon, Colleen Harvey, NP

## 2021-03-24 ENCOUNTER — Other Ambulatory Visit: Payer: Self-pay | Admitting: Pediatrics

## 2021-03-24 MED ORDER — DULOXETINE HCL 20 MG PO CPEP: 40.0000 mg | ORAL_CAPSULE | Freq: Every day | ORAL | 2 refills | Status: DC

## 2021-03-24 MED ORDER — SPIRONOLACTONE 25 MG PO TABS: ORAL_TABLET | ORAL | 1 refills | Status: DC

## 2021-04-06 ENCOUNTER — Telehealth: Payer: Medicaid Other | Admitting: Pediatrics

## 2021-04-06 ENCOUNTER — Other Ambulatory Visit: Payer: Self-pay

## 2021-04-08 ENCOUNTER — Telehealth: Payer: Medicaid Other | Admitting: Pediatrics

## 2021-04-08 ENCOUNTER — Telehealth (INDEPENDENT_AMBULATORY_CARE_PROVIDER_SITE_OTHER): Payer: Medicaid Other | Admitting: Pediatrics

## 2021-04-08 DIAGNOSIS — L700 Acne vulgaris: Secondary | ICD-10-CM | POA: Diagnosis not present

## 2021-04-08 DIAGNOSIS — E282 Polycystic ovarian syndrome: Secondary | ICD-10-CM | POA: Diagnosis not present

## 2021-04-08 DIAGNOSIS — F4323 Adjustment disorder with mixed anxiety and depressed mood: Secondary | ICD-10-CM

## 2021-04-08 DIAGNOSIS — R41840 Attention and concentration deficit: Secondary | ICD-10-CM

## 2021-04-08 MED ORDER — METHYLPHENIDATE HCL ER (OSM) 18 MG PO TBCR: 18.0000 mg | EXTENDED_RELEASE_TABLET | Freq: Every day | ORAL | 0 refills | Status: DC

## 2021-04-08 NOTE — Progress Notes (Signed)
THIS RECORD MAY CONTAIN CONFIDENTIAL INFORMATION THAT SHOULD NOT BE RELEASED WITHOUT REVIEW OF THE SERVICE PROVIDER.  Virtual Follow-Up Visit via Video Note  I connected with Colleen Gordon 's patient  on 04/08/21 at  1:30 PM EDT by a video enabled telemedicine application and verified that I am speaking with the correct person using two identifiers.   Patient/parent location: Home   I discussed the limitations of evaluation and management by telemedicine and the availability of in person appointments.  I discussed that the purpose of this telehealth visit is to provide medical care while limiting exposure to the novel coronavirus.  The patient expressed understanding and agreed to proceed.   Colleen Gordon is a 20 y.o. female referred by [name], NP here today for follow-up of anxiety, inattention, pcos.  Previsit planning completed:  yes   History was provided by the patient.  Supervising Physician: Dr. Delorse Lek  Plan from Last Visit:   Start spironolactone again for acne, increase cymbalta to 40 mg daily   Chief Complaint: Med /fu  History of Present Illness:  Still has noticed some symptoms of anxiety. About a 5/10. Stopped seeing therapist about 1 month ago. Does feel it was helpful but didn't want to go anymore.   Has had some leg cramps with spiro. Didn't start until week 2.   Periods have been pretty regular with birth control. No concerns.   Review of Systems  Constitutional: Negative for malaise/fatigue.  Eyes: Negative for double vision.  Respiratory: Negative for shortness of breath.   Cardiovascular: Negative for chest pain and palpitations.  Gastrointestinal: Positive for abdominal pain. Negative for constipation, diarrhea, nausea and vomiting.  Genitourinary: Negative for dysuria.  Musculoskeletal: Negative for joint pain and myalgias.  Skin: Negative for rash.  Neurological: Negative for dizziness and headaches.  Endo/Heme/Allergies: Does not bruise/bleed  easily.     No Known Allergies Outpatient Medications Prior to Visit  Medication Sig Dispense Refill  . spironolactone (ALDACTONE) 25 MG tablet Take 1 tablet (25 mg total) by mouth daily for 7 days, THEN 2 tablets (50 mg total) daily for 23 days. 53 tablet 1  . BENZACLIN gel Apply topically every morning. 25 g 11  . DULoxetine (CYMBALTA) 20 MG capsule Take 2 capsules (40 mg total) by mouth daily. 60 capsule 2  . JUNEL 1.5/30 1.5-30 MG-MCG tablet TAKE 1 TABLET BY MOUTH EVERY DAY 21 tablet 3  . tretinoin (RETIN-A) 0.05 % cream Apply topically at bedtime. 45 g 6  . Vitamin D, Ergocalciferol, (DRISDOL) 1.25 MG (50000 UNIT) CAPS capsule Take 1 capsule (50,000 Units total) by mouth every 7 (seven) days. 8 capsule 0   No facility-administered medications prior to visit.     Patient Active Problem List   Diagnosis Date Noted  . Sleep disturbance 11/24/2020  . Difficulty concentrating 10/03/2020  . Adjustment disorder with mixed anxiety and depressed mood 11/15/2018  . Breakthrough bleeding on Nexplanon 11/15/2018  . Acne vulgaris 11/15/2018  . PCOS (polycystic ovarian syndrome) 10/31/2018  . Irregular periods/menstrual cycles 07/17/2018  . Encounter for routine child health examination without abnormal findings 08/10/2017  . BMI (body mass index), pediatric, 5% to less than 85% for age 25/15/2018    The following portions of the patient's history were reviewed and updated as appropriate: allergies, current medications, past family history, past medical history, past social history, past surgical history and problem list.  Visual Observations/Objective:   General Appearance: Well nourished well developed, in no apparent distress.  Eyes:  conjunctiva no swelling or erythema ENT/Mouth: No hoarseness, No cough for duration of visit.  Neck: Supple  Respiratory: Respiratory effort normal, normal rate, no retractions or distress.   Cardio: Appears well-perfused, noncyanotic Musculoskeletal:  no obvious deformity Skin: visible skin without rashes, ecchymosis, erythema Neuro: Awake and oriented X 3,  Psych:  normal affect, Insight and Judgment appropriate.    Assessment/Plan: 1. Adjustment disorder with mixed anxiety and depressed mood Continue cymbalta 40 mg daily. She has overall had some improvement.   2. PCOS (polycystic ovarian syndrome) Continue spironolactone 50 mg daily. Advised increasing fluid intake and monitoring, do not suspect hypokalemia based on age and known normal renal function.   3. Acne vulgaris As above.   4. Difficulty concentrating Will try low dose of concerta 18 mg to assess for improvement in inattention and anxiety. Previous [facility] screen was not definitively positive for ADHD, however, inattention persists which seems to be exacerbating anxiety. Will monitor closely.    BH screenings:  ASRS: Top: 4/6, Bottom: 3/12  PHQ-SADS Last 3 Score only 03/03/2021 10/27/2020 10/20/2020  PHQ-15 Score 4 6 5   Total GAD-7 Score 10 12 13   PHQ-9 Total Score 5 13 12     Screens discussed with patient and parent and adjustments to plan made accordingly.   I discussed the assessment and treatment plan with the patient and/or parent/guardian.  They were provided an opportunity to ask questions and all were answered.  They agreed with the plan and demonstrated an understanding of the instructions. They were advised to call back or seek an in-person evaluation in the emergency room if the symptoms worsen or if the condition fails to improve as anticipated.   Follow-up:  2 weeks, via video per pt preference   Medical decision-making:   I spent 25 minutes on this telehealth visit inclusive of face-to-face video and care coordination time I was located in clinic during this encounter.   , FNP    CC: , , NP, Alfonso Ramus, Janene Harvey, NP

## 2021-04-29 ENCOUNTER — Telehealth (INDEPENDENT_AMBULATORY_CARE_PROVIDER_SITE_OTHER): Payer: Medicaid Other | Admitting: Pediatrics

## 2021-04-29 DIAGNOSIS — F900 Attention-deficit hyperactivity disorder, predominantly inattentive type: Secondary | ICD-10-CM | POA: Diagnosis not present

## 2021-04-29 DIAGNOSIS — E282 Polycystic ovarian syndrome: Secondary | ICD-10-CM | POA: Diagnosis not present

## 2021-04-29 DIAGNOSIS — L700 Acne vulgaris: Secondary | ICD-10-CM

## 2021-04-29 DIAGNOSIS — G479 Sleep disorder, unspecified: Secondary | ICD-10-CM

## 2021-04-29 DIAGNOSIS — F4323 Adjustment disorder with mixed anxiety and depressed mood: Secondary | ICD-10-CM

## 2021-04-29 MED ORDER — NORGESTIMATE-ETH ESTRADIOL 0.25-35 MG-MCG PO TABS: ORAL_TABLET | ORAL | 4 refills | Status: DC

## 2021-04-29 MED ORDER — SPIRONOLACTONE 50 MG PO TABS: 50.0000 mg | ORAL_TABLET | Freq: Every day | ORAL | 3 refills | Status: DC

## 2021-04-29 MED ORDER — TRETINOIN 0.05 % EX CREA: TOPICAL_CREAM | Freq: Every day | CUTANEOUS | 6 refills | Status: DC

## 2021-04-29 MED ORDER — DULOXETINE HCL 40 MG PO CPEP: 40.0000 mg | ORAL_CAPSULE | Freq: Every day | ORAL | 3 refills | Status: DC

## 2021-04-29 NOTE — Progress Notes (Signed)
THIS RECORD MAY CONTAIN CONFIDENTIAL INFORMATION THAT SHOULD NOT BE RELEASED WITHOUT REVIEW OF THE SERVICE PROVIDER.  Virtual Follow-Up Visit via Video Note  I connected with Colleen Gordon 's patient  on 04/29/21 at  1:30 PM EDT by a video enabled telemedicine application and verified that I am speaking with the correct person using two identifiers.   Patient/parent location: At work in [place]   I discussed the limitations of evaluation and management by telemedicine and the availability of in person appointments.  I discussed that the purpose of this telehealth visit is to provide medical care while limiting exposure to the novel coronavirus.  The patient expressed understanding and agreed to proceed.   Colleen Gordon is a 19 y.o. female referred by [name], NP here today for follow-up of ADHD, anxiety, sleep disturbance, PCOS.  Previsit planning completed:  yes   History was provided by the patient.  Supervising Physician: Dr. Delorse Lek  Plan from Last Visit:   Start concerta 18 mg daily   Chief Complaint: Med f/u  History of Present Illness:  In the first week of concerta she definitely had a few side effects of decreased appetite, sweating and issues with sleep. Concentration was good when she was taking the concerta x 1 week but has worn off some. She was able to sit down and get her work done much faster and in one sitting. She was able to have an easier time at work and have conversations more easily and feel more relaxed.   She started another pack of birth control instead of waiting a week so she could skip her period at the beach. She now has been having super light flow, no cramping.   Needs refill of tretinoin. [name] is going ok and acne does seem some better. She is open to change in OCP to more anti-androgen progestin to help as well.    No Known Allergies Outpatient Medications Prior to Visit  Medication Sig Dispense Refill  . spironolactone (ALDACTONE) 25 MG  tablet Take 1 tablet (25 mg total) by mouth daily for 7 days, THEN 2 tablets (50 mg total) daily for 23 days. 53 tablet 1  . BENZACLIN gel Apply topically every morning. 25 g 11  . DULoxetine (CYMBALTA) 20 MG capsule Take 2 capsules (40 mg total) by mouth daily. 60 capsule 2  . JUNEL 1.5/30 1.5-30 MG-MCG tablet TAKE 1 TABLET BY MOUTH EVERY DAY 21 tablet 3  . methylphenidate (CONCERTA) 18 MG PO CR tablet Take 1 tablet (18 mg total) by mouth daily. 30 tablet 0  . tretinoin (RETIN-A) 0.05 % cream Apply topically at bedtime. 45 g 6  . Vitamin D, Ergocalciferol, (DRISDOL) 1.25 MG (50000 UNIT) CAPS capsule Take 1 capsule (50,000 Units total) by mouth every 7 (seven) days. 8 capsule 0   No facility-administered medications prior to visit.     Patient Active Problem List   Diagnosis Date Noted  . Sleep disturbance 11/24/2020  . Difficulty concentrating 10/03/2020  . Adjustment disorder with mixed anxiety and depressed mood 11/15/2018  . Breakthrough bleeding on Nexplanon 11/15/2018  . Acne vulgaris 11/15/2018  . PCOS (polycystic ovarian syndrome) 10/31/2018  . Irregular periods/menstrual cycles 07/17/2018  . Encounter for routine child health examination without abnormal findings 08/10/2017  . BMI (body mass index), pediatric, 5% to less than 85% for age 56/15/2018    The following portions of the patient's history were reviewed and updated as appropriate: allergies, current medications, past family history, past medical  history, past social history, past surgical history and problem list.  Visual Observations/Objective:   General Appearance: Well nourished well developed, in no apparent distress.  Eyes: conjunctiva no swelling or erythema ENT/Mouth: No hoarseness, No cough for duration of visit.  Neck: Supple  Respiratory: Respiratory effort normal, normal rate, no retractions or distress.   Cardio: Appears well-perfused, noncyanotic Musculoskeletal: no obvious deformity Skin: visible  skin without rashes, ecchymosis, erythema Neuro: Awake and oriented X 3,  Psych:  normal affect, Insight and Judgment appropriate.    Assessment/Plan: 1. Acne vulgaris Continue spiro 50 mg and tretinoin cream as needed. Switch to sprintec and start now since she is having some BTB.  - spironolactone (ALDACTONE) 50 MG tablet; Take 1 tablet (50 mg total) by mouth daily.  Dispense: 30 tablet; Refill: 3 - norgestimate-ethinyl estradiol (SPRINTEC 28) 0.25-35 MG-MCG tablet; Take 1 tablet daily. Discard placebos and take active pills for continuous cycling  Dispense: 112 tablet; Refill: 4 - tretinoin (RETIN-A) 0.05 % cream; Apply topically at bedtime.  Dispense: 45 g; Refill: 6  2. PCOS (polycystic ovarian syndrome) As above, due for labs at next in clinic visit.  - spironolactone (ALDACTONE) 50 MG tablet; Take 1 tablet (50 mg total) by mouth daily.  Dispense: 30 tablet; Refill: 3 - norgestimate-ethinyl estradiol (SPRINTEC 28) 0.25-35 MG-MCG tablet; Take 1 tablet daily. Discard placebos and take active pills for continuous cycling  Dispense: 112 tablet; Refill: 4  3. Sleep disturbance Discussed taking concerta no later than about 8:30 am. She was in agremeent.   4. Adjustment disorder with mixed anxiety and depressed mood Stable on duloxetine 40 mg.  - DULoxetine HCl 40 MG CPEP; Take 40 mg by mouth daily.  Dispense: 30 capsule; Refill: 3  5. ADHD, predominantly inattentive type Increase concerta. Discussed taking 18 mg x 2 and letting me know effect. If s/e, will send 27 mg script.    I discussed the assessment and treatment plan with the patient and/or parent/guardian.  They were provided an opportunity to ask questions and all were answered.  They agreed with the plan and demonstrated an understanding of the instructions. They were advised to call back or seek an in-person evaluation in the emergency room if the symptoms worsen or if the condition fails to improve as  anticipated.   Follow-up:  4 weeks or sooner as needed   Medical decision-making:   I spent 25 minutes on this telehealth visit inclusive of face-to-face video and care coordination time I was located in clinic during this encounter.   Alfonso Ramus, FNP    CC: Janene Harvey, Pascal Lux, NP, Janene Harvey, Pascal Lux, NP

## 2021-05-04 ENCOUNTER — Other Ambulatory Visit: Payer: Self-pay | Admitting: Pediatrics

## 2021-05-04 MED ORDER — METHYLPHENIDATE HCL ER (OSM) 36 MG PO TBCR: 36.0000 mg | EXTENDED_RELEASE_TABLET | Freq: Every day | ORAL | 0 refills | Status: DC

## 2021-05-27 ENCOUNTER — Telehealth (INDEPENDENT_AMBULATORY_CARE_PROVIDER_SITE_OTHER): Payer: Medicaid Other | Admitting: Pediatrics

## 2021-05-27 DIAGNOSIS — F4323 Adjustment disorder with mixed anxiety and depressed mood: Secondary | ICD-10-CM | POA: Diagnosis not present

## 2021-05-27 DIAGNOSIS — E282 Polycystic ovarian syndrome: Secondary | ICD-10-CM | POA: Diagnosis not present

## 2021-05-27 DIAGNOSIS — F900 Attention-deficit hyperactivity disorder, predominantly inattentive type: Secondary | ICD-10-CM | POA: Diagnosis not present

## 2021-05-27 MED ORDER — SPIRONOLACTONE 100 MG PO TABS: 100.0000 mg | ORAL_TABLET | Freq: Every day | ORAL | 2 refills | Status: DC

## 2021-05-27 MED ORDER — METHYLPHENIDATE HCL 5 MG PO TABS: ORAL_TABLET | ORAL | 0 refills | Status: DC

## 2021-05-27 MED ORDER — METHYLPHENIDATE HCL ER (OSM) 36 MG PO TBCR: 36.0000 mg | EXTENDED_RELEASE_TABLET | Freq: Every day | ORAL | 0 refills | Status: DC

## 2021-05-27 NOTE — Progress Notes (Signed)
THIS RECORD MAY CONTAIN CONFIDENTIAL INFORMATION THAT SHOULD NOT BE RELEASED WITHOUT REVIEW OF THE SERVICE PROVIDER.  Virtual Follow-Up Visit via Video Note  I connected with Colleen Gordon 's patient  on 05/27/21 at  1:30 PM EDT by a video enabled telemedicine application and verified that I am speaking with the correct person using two identifiers.   Patient/parent location: at work in the car    I discussed the limitations of evaluation and management by telemedicine and the availability of in person appointments.  I discussed that the purpose of this telehealth visit is to provide medical care while limiting exposure to the novel coronavirus.  The patient expressed understanding and agreed to proceed.   Colleen Gordon is a 20 y.o. female referred by Colleen June, NP here today for follow-up of ADHD, anxiety, depression, PCOS.  Previsit planning completed:  yes   History was provided by the patient.  Supervising Physician: Dr. Delorse Lek  Plan from Last Visit:   Increase concerta dose   Chief Complaint: Med f/u  History of Present Illness:  Does have some sweating and loss of appetite with the concerta but focus is good. Around 5 pm it starts to wear off and then she is more tired. She is usually finishing up her last hour of work around this time which is bothersome.   Anxiety symptoms may have increased slightly with the concerta but has otherwise been pretty good. Sleeping well. Denies depression.   Feels like acne is starting to clear up some. Still having some oiliness of skin. Agreeable to increasing spironolactone.   Denies concerns with menstrual cycle. Started new OCP and has not had a period since, though should be starting soon.   Has some fun planned this summer going to [employer] and on a beach trip. Otherwise doing some online classes and working.    No Known Allergies Outpatient Medications Prior to Visit  Medication Sig Dispense Refill  . BENZACLIN gel Apply  topically every morning. 25 g 11  . DULoxetine HCl 40 MG CPEP Take 40 mg by mouth daily. 30 capsule 3  . methylphenidate (CONCERTA) 36 MG PO CR tablet Take 1 tablet (36 mg total) by mouth daily. 30 tablet 0  . norgestimate-ethinyl estradiol (SPRINTEC 28) 0.25-35 MG-MCG tablet Take 1 tablet daily. Discard placebos and take active pills for continuous cycling 112 tablet 4  . spironolactone (ALDACTONE) 50 MG tablet Take 1 tablet (50 mg total) by mouth daily. 30 tablet 3  . tretinoin (RETIN-A) 0.05 % cream Apply topically at bedtime. 45 g 6  . Vitamin D, Ergocalciferol, (DRISDOL) 1.25 MG (50000 UNIT) CAPS capsule Take 1 capsule (50,000 Units total) by mouth every 7 (seven) days. 8 capsule 0   No facility-administered medications prior to visit.     Patient Active Problem List   Diagnosis Date Noted  . Sleep disturbance 11/24/2020  . Difficulty concentrating 10/03/2020  . Adjustment disorder with mixed anxiety and depressed mood 11/15/2018  . Breakthrough bleeding on Nexplanon 11/15/2018  . Acne vulgaris 11/15/2018  . PCOS (polycystic ovarian syndrome) 10/31/2018  . Irregular periods/menstrual cycles 07/17/2018  . Encounter for routine child health examination without abnormal findings 08/10/2017  . BMI (body mass index), pediatric, 5% to less than 85% for age 09/10/2017    The following portions of the patient's history were reviewed and updated as appropriate: allergies, current medications, past family history, past medical history, past social history, past surgical history and problem list.  Visual Observations/Objective:  General Appearance: Well nourished well developed, in no apparent distress.  Eyes: conjunctiva no swelling or erythema ENT/Mouth: No hoarseness, No cough for duration of visit.  Neck: Supple  Respiratory: Respiratory effort normal, normal rate, no retractions or distress.   Cardio: Appears well-perfused, noncyanotic Musculoskeletal: no obvious deformity Skin:  visible skin without rashes, ecchymosis, erythema Neuro: Awake and oriented X 3,  Psych:  normal affect, Insight and Judgment appropriate.    Assessment/Plan: 1. PCOS (polycystic ovarian syndrome) Continue sprintec and increase spironolactone to 100 mg daily.   2. Adjustment disorder with mixed anxiety and depressed mood Continue cymbalta daily. Overall doing well. Will repeat PHQSADs at next visit.   3. ADHD, predominantly inattentive type Continue concerta 36 mg. Add ritalin 5 mg every afternoon 1 hour before dose wears off (3-4 pm). Otherwise has seen good benefit.    BH screenings:  PHQ-SADS Last 3 Score only 03/03/2021 10/27/2020 10/20/2020  PHQ-15 Score 4 6 5   Total GAD-7 Score 10 12 13   PHQ-9 Total Score 5 13 12     Screens discussed with patient and parent and adjustments to plan made accordingly.   I discussed the assessment and treatment plan with the patient and/or parent/guardian.  They were provided an opportunity to ask questions and all were answered.  They agreed with the plan and demonstrated an understanding of the instructions. They were advised to call back or seek an in-person evaluation in the emergency room if the symptoms worsen or if the condition fails to improve as anticipated.   Follow-up:  10 weeks or sooner as needed   Medical decision-making:   I spent 25 minutes on this telehealth visit inclusive of face-to-face video and care coordination time I was located in clinic during this encounter.   , FNP    CC: , , NP, Alfonso Ramus, Janene Harvey, NP

## 2021-06-26 ENCOUNTER — Other Ambulatory Visit: Payer: Self-pay

## 2021-06-26 DIAGNOSIS — F900 Attention-deficit hyperactivity disorder, predominantly inattentive type: Secondary | ICD-10-CM

## 2021-06-26 MED ORDER — METHYLPHENIDATE HCL ER (OSM) 36 MG PO TBCR: 36.0000 mg | EXTENDED_RELEASE_TABLET | Freq: Every day | ORAL | 0 refills | Status: DC

## 2021-07-10 ENCOUNTER — Other Ambulatory Visit: Payer: Self-pay

## 2021-07-10 ENCOUNTER — Ambulatory Visit (INDEPENDENT_AMBULATORY_CARE_PROVIDER_SITE_OTHER): Payer: Medicaid Other | Admitting: Pediatrics

## 2021-07-10 DIAGNOSIS — Z23 Encounter for immunization: Secondary | ICD-10-CM

## 2021-07-10 NOTE — Progress Notes (Signed)
Presented today for HPV #3 vaccine. No new questions on vaccine. Parent was counseled on risks benefits of vaccine and parent verbalized understanding. Handout (VIS) given for each vaccine.   --Indications, contraindications and side effects of vaccine/vaccines discussed with parent and parent verbally expressed understanding and also agreed with the administration of vaccine/vaccines as ordered above  today.

## 2021-08-18 ENCOUNTER — Ambulatory Visit: Payer: Medicaid Other | Admitting: Pediatrics

## 2021-08-18 ENCOUNTER — Other Ambulatory Visit: Payer: Self-pay | Admitting: Family

## 2021-08-18 DIAGNOSIS — F900 Attention-deficit hyperactivity disorder, predominantly inattentive type: Secondary | ICD-10-CM

## 2021-08-18 MED ORDER — METHYLPHENIDATE HCL ER (OSM) 36 MG PO TBCR: 36.0000 mg | EXTENDED_RELEASE_TABLET | Freq: Every day | ORAL | 0 refills | Status: DC

## 2021-08-25 ENCOUNTER — Other Ambulatory Visit: Payer: Self-pay

## 2021-08-25 ENCOUNTER — Ambulatory Visit (INDEPENDENT_AMBULATORY_CARE_PROVIDER_SITE_OTHER): Payer: Medicaid Other | Admitting: Pediatrics

## 2021-08-25 ENCOUNTER — Encounter: Payer: Self-pay | Admitting: Pediatrics

## 2021-08-25 VITALS — BP 121/83 | HR 83 | Ht 61.0 in | Wt 100.0 lb

## 2021-08-25 DIAGNOSIS — N921 Excessive and frequent menstruation with irregular cycle: Secondary | ICD-10-CM | POA: Diagnosis not present

## 2021-08-25 DIAGNOSIS — F900 Attention-deficit hyperactivity disorder, predominantly inattentive type: Secondary | ICD-10-CM

## 2021-08-25 DIAGNOSIS — F4323 Adjustment disorder with mixed anxiety and depressed mood: Secondary | ICD-10-CM | POA: Diagnosis not present

## 2021-08-25 DIAGNOSIS — G479 Sleep disorder, unspecified: Secondary | ICD-10-CM | POA: Diagnosis not present

## 2021-08-25 DIAGNOSIS — Z975 Presence of (intrauterine) contraceptive device: Secondary | ICD-10-CM

## 2021-08-25 DIAGNOSIS — E282 Polycystic ovarian syndrome: Secondary | ICD-10-CM

## 2021-08-25 DIAGNOSIS — L700 Acne vulgaris: Secondary | ICD-10-CM | POA: Diagnosis not present

## 2021-08-25 MED ORDER — METHYLPHENIDATE HCL ER 36 MG PO TB24: 36.0000 mg | ORAL_TABLET | Freq: Every day | ORAL | 0 refills | Status: DC

## 2021-08-25 MED ORDER — METHYLPHENIDATE HCL ER (OSM) 36 MG PO TBCR: 36.0000 mg | EXTENDED_RELEASE_TABLET | Freq: Every day | ORAL | 0 refills | Status: DC

## 2021-08-25 MED ORDER — DULOXETINE HCL 40 MG PO CPEP: 40.0000 mg | ORAL_CAPSULE | Freq: Every day | ORAL | 1 refills | Status: DC

## 2021-08-25 MED ORDER — SPIRONOLACTONE 100 MG PO TABS: 100.0000 mg | ORAL_TABLET | Freq: Every day | ORAL | 1 refills | Status: DC

## 2021-08-25 NOTE — Progress Notes (Signed)
History was provided by the patient.  Colleen Gordon is a 20 y.o. female who is here for PCOS, anxiety, ADHD.  Colleen June, NP   HPI:  Pt reports she still has some sweating with concerta but concentration is really good. She is sleeping well. Using ritalin sparingly.   Has bene having some GI issues recently. Loose bowels when she has greasy or spicy foods.   Back to school in week 3.   Still taking OCP- periods are ok. She did miss one week of pill but restarted. Is sexually active. Was using condoms when she missed it. Nexplanon coming up on year 3. Colleen Gordon is going ok.   PHQ-SADS Last 3 Score only 08/25/2021 03/03/2021 10/27/2020  PHQ-15 Score 6 4 6   Total GAD-7 Score 3 10 12   PHQ-9 Total Score 4 5 13      No LMP recorded. (Menstrual status: Irregular Periods).   Patient Active Problem List   Diagnosis Date Noted   ADHD, predominantly inattentive type 05/27/2021   Sleep disturbance 11/24/2020   Difficulty concentrating 10/03/2020   Adjustment disorder with mixed anxiety and depressed mood 11/15/2018   Breakthrough bleeding on Nexplanon 11/15/2018   Acne vulgaris 11/15/2018   PCOS (polycystic ovarian syndrome) 10/31/2018   Irregular periods/menstrual cycles 07/17/2018   Encounter for routine child health examination without abnormal findings 08/10/2017   BMI (body mass index), pediatric, 5% to less than 85% for age 77/15/2018    Current Outpatient Medications on File Prior to Visit  Medication Sig Dispense Refill   BENZACLIN gel Apply topically every morning. 25 g 11   DULoxetine HCl 40 MG CPEP Take 40 mg by mouth daily. 30 capsule 3   methylphenidate (CONCERTA) 36 MG PO CR tablet Take 1 tablet (36 mg total) by mouth daily. 30 tablet 0   methylphenidate (RITALIN) 5 MG tablet Take 1 tablet in the afternoon by mouth 60 tablet 0   norgestimate-ethinyl estradiol (SPRINTEC 28) 0.25-35 MG-MCG tablet Take 1 tablet daily. Discard placebos and take active pills for continuous cycling  112 tablet 4   spironolactone (ALDACTONE) 100 MG tablet Take 1 tablet (100 mg total) by mouth daily. 30 tablet 2   tretinoin (RETIN-A) 0.05 % cream Apply topically at bedtime. 45 g 6   Vitamin D, Ergocalciferol, (DRISDOL) 1.25 MG (50000 UNIT) CAPS capsule Take 1 capsule (50,000 Units total) by mouth every 7 (seven) days. 8 capsule 0   No current facility-administered medications on file prior to visit.    No Known Allergies   Physical Exam:    Vitals:   08/25/21 0846  BP: 121/83  Pulse: 83  Weight: 100 lb (45.4 kg)  Height: 5\' 1"  (1.549 m)    Growth percentile SmartLinks can only be used for patients less than 56 years old.  Physical Exam Vitals and nursing note reviewed.  Constitutional:      General: She is not in acute distress.    Appearance: She is well-developed.  Neck:     Thyroid: No thyromegaly.  Cardiovascular:     Rate and Rhythm: Normal rate and regular rhythm.     Heart sounds: No murmur heard. Pulmonary:     Breath sounds: Normal breath sounds.  Abdominal:     Palpations: Abdomen is soft. There is no mass.     Tenderness: There is no abdominal tenderness. There is no guarding.  Musculoskeletal:     Right lower leg: No edema.     Left lower leg: No edema.  Lymphadenopathy:  Cervical: No cervical adenopathy.  Skin:    General: Skin is warm.     Capillary Refill: Capillary refill takes less than 2 seconds.     Findings: No rash.  Neurological:     Mental Status: She is alert.     Comments: No tremor    Assessment/Plan: 1. PCOS (polycystic ovarian syndrome) Yearly labs for comorbidities today. Doing well on spironolactone and nexplanon.  - CBC with Differential/Platelet - Comprehensive metabolic panel - Hemoglobin A1c - Lipid panel - VITAMIN D 25 Hydroxy (Vit-D Deficiency, Fractures)  2. ADHD, predominantly inattentive type Continue concereta.  - methylphenidate (CONCERTA) 36 MG PO CR tablet; Take 1 tablet (36 mg total) by mouth daily.   Dispense: 30 tablet; Refill: 0 - methylphenidate 36 MG PO CR tablet; Take 1 tablet (36 mg total) by mouth daily with breakfast.  Dispense: 30 tablet; Refill: 0  3. Adjustment disorder with mixed anxiety and depressed mood Continue duloxetine.  - DULoxetine HCl 40 MG CPEP; Take 40 mg by mouth daily.  Dispense: 90 capsule; Refill: 1  4. Sleep disturbance Sleeping well now.   5. Breakthrough bleeding on Nexplanon Continues to use ocp as needed for btb.   6. Acne vulgaris S/p accutane- now on spiro to control some acne that has returned.  - spironolactone (ALDACTONE) 100 MG tablet; Take 1 tablet (100 mg total) by mouth daily.  Dispense: 90 tablet; Refill: 1  Return in 3 months or sooner as needed   Colleen Ramus, FNP

## 2021-08-26 LAB — LIPID PANEL
Cholesterol: 209 mg/dL — ABNORMAL HIGH (ref <200)
HDL: 70 mg/dL (ref >=50)
LDL Cholesterol (Calc): 118 mg/dL (calc) — ABNORMAL HIGH
Non-HDL Cholesterol (Calc): 139 mg/dL (calc) — ABNORMAL HIGH (ref <130)
Total CHOL/HDL Ratio: 3 (calc) (ref <5.0)
Triglycerides: 100 mg/dL (ref <150)

## 2021-08-26 LAB — CBC WITH DIFFERENTIAL/PLATELET
Absolute Monocytes: 338 cells/uL (ref 200–950)
Basophils Absolute: 49 cells/uL (ref 0–200)
Basophils Relative: 1 %
Eosinophils Absolute: 69 cells/uL (ref 15–500)
Eosinophils Relative: 1.4 %
HCT: 44.1 % (ref 35.0–45.0)
Hemoglobin: 14.8 g/dL (ref 11.7–15.5)
Lymphs Abs: 1735 cells/uL (ref 850–3900)
MCH: 30.5 pg (ref 27.0–33.0)
MCHC: 33.6 g/dL (ref 32.0–36.0)
MCV: 90.7 fL (ref 80.0–100.0)
MPV: 9.7 fL (ref 7.5–12.5)
Monocytes Relative: 6.9 %
Neutro Abs: 2710 cells/uL (ref 1500–7800)
Neutrophils Relative %: 55.3 %
Platelets: 266 10*3/uL (ref 140–400)
RBC: 4.86 10*6/uL (ref 3.80–5.10)
RDW: 11.9 % (ref 11.0–15.0)
Total Lymphocyte: 35.4 %
WBC: 4.9 10*3/uL (ref 3.8–10.8)

## 2021-08-26 LAB — HEMOGLOBIN A1C
Hgb A1c MFr Bld: 5 % of total Hgb (ref <5.7)
Mean Plasma Glucose: 97 mg/dL
eAG (mmol/L): 5.4 mmol/L

## 2021-08-26 LAB — COMPREHENSIVE METABOLIC PANEL
AG Ratio: 1.6 (calc) (ref 1.0–2.5)
ALT: 83 U/L — ABNORMAL HIGH (ref 6–29)
AST: 67 U/L — ABNORMAL HIGH (ref 10–30)
Albumin: 4.5 g/dL (ref 3.6–5.1)
Alkaline phosphatase (APISO): 82 U/L (ref 31–125)
BUN: 12 mg/dL (ref 7–25)
CO2: 26 mmol/L (ref 20–32)
Calcium: 10.1 mg/dL (ref 8.6–10.2)
Chloride: 103 mmol/L (ref 98–110)
Creat: 0.68 mg/dL (ref 0.50–0.96)
Globulin: 2.9 g/dL (calc) (ref 1.9–3.7)
Glucose, Bld: 84 mg/dL (ref 65–99)
Potassium: 4.2 mmol/L (ref 3.5–5.3)
Sodium: 140 mmol/L (ref 135–146)
Total Bilirubin: 0.6 mg/dL (ref 0.2–1.2)
Total Protein: 7.4 g/dL (ref 6.1–8.1)

## 2021-08-26 LAB — VITAMIN D 25 HYDROXY (VIT D DEFICIENCY, FRACTURES): Vit D, 25-Hydroxy: 19 ng/mL — ABNORMAL LOW (ref 30–100)

## 2021-11-17 ENCOUNTER — Ambulatory Visit: Payer: Medicaid Other | Admitting: Pediatrics

## 2021-12-01 ENCOUNTER — Ambulatory Visit: Payer: Medicaid Other | Admitting: Pediatrics

## 2021-12-15 ENCOUNTER — Other Ambulatory Visit: Payer: Self-pay

## 2021-12-15 ENCOUNTER — Ambulatory Visit (INDEPENDENT_AMBULATORY_CARE_PROVIDER_SITE_OTHER): Payer: Medicaid Other | Admitting: Pediatrics

## 2021-12-15 ENCOUNTER — Encounter: Payer: Self-pay | Admitting: Pediatrics

## 2021-12-15 VITALS — BP 122/79 | HR 88 | Ht 60.63 in | Wt 110.0 lb

## 2021-12-15 DIAGNOSIS — E282 Polycystic ovarian syndrome: Secondary | ICD-10-CM

## 2021-12-15 DIAGNOSIS — R7989 Other specified abnormal findings of blood chemistry: Secondary | ICD-10-CM | POA: Diagnosis not present

## 2021-12-15 DIAGNOSIS — F4323 Adjustment disorder with mixed anxiety and depressed mood: Secondary | ICD-10-CM

## 2021-12-15 DIAGNOSIS — E559 Vitamin D deficiency, unspecified: Secondary | ICD-10-CM | POA: Diagnosis not present

## 2021-12-15 DIAGNOSIS — L700 Acne vulgaris: Secondary | ICD-10-CM | POA: Diagnosis not present

## 2021-12-15 DIAGNOSIS — F900 Attention-deficit hyperactivity disorder, predominantly inattentive type: Secondary | ICD-10-CM | POA: Diagnosis not present

## 2021-12-15 MED ORDER — DULOXETINE HCL 40 MG PO CPEP
40.0000 mg | ORAL_CAPSULE | Freq: Every day | ORAL | 1 refills | Status: DC
Start: 2021-12-15 — End: 2022-07-20

## 2021-12-15 MED ORDER — METHYLPHENIDATE HCL ER (OSM) 36 MG PO TBCR: 36.0000 mg | EXTENDED_RELEASE_TABLET | Freq: Every day | ORAL | 0 refills | Status: DC

## 2021-12-15 MED ORDER — CLINDAMYCIN PHOS-BENZOYL PEROX 1.2-5 % EX GEL: 1.0000 "application " | Freq: Every morning | CUTANEOUS | 6 refills | Status: DC

## 2021-12-15 NOTE — Patient Instructions (Signed)
Start duac gel in the morning on your face  Continue retin a at night  Continue other meds

## 2021-12-15 NOTE — Progress Notes (Signed)
History was provided by the patient.  Colleen Gordon is a 20 y.o. female who is here for pcos, acne, adhd, anxiety and depression.  [name], NP   HPI:  Pt reports things have been good. Sometimes in her sleep she will be very restless and will end up scratching her pillow or blanket- used to do this when she was a kid.   She is at the 3 year product life for nexplanon- ok to keep it for 5 which we discussed. Bleeding is lasting about 3-4 days and was last at the beginning of this month. Not recently more than once a month.   Mood has been a lot better and able to concentrate better. She continues to be in college and is doing well with this.   A few times had some nausea with concerta dose in the AM. Is not eating before taking concerta.   PHQ-SADS Last 3 Score only 12/15/2021 08/25/2021 03/03/2021  PHQ-15 Score 4 6 4   Total GAD-7 Score 3 3 10   PHQ Adolescent Score 3 4 5      No LMP recorded (lmp unknown). (Menstrual status: Irregular Periods).   Patient Active Problem List   Diagnosis Date Noted   ADHD, predominantly inattentive type 05/27/2021   Sleep disturbance 11/24/2020   Adjustment disorder with mixed anxiety and depressed mood 11/15/2018   Breakthrough bleeding on Nexplanon 11/15/2018   Acne vulgaris 11/15/2018   PCOS (polycystic ovarian syndrome) 10/31/2018    Current Outpatient Medications on File Prior to Visit  Medication Sig Dispense Refill   BENZACLIN gel Apply topically every morning. 25 g 11   DULoxetine HCl 40 MG CPEP Take 40 mg by mouth daily. 90 capsule 1   methylphenidate (CONCERTA) 36 MG PO CR tablet Take 1 tablet (36 mg total) by mouth daily. 30 tablet 0   methylphenidate (RITALIN) 5 MG tablet Take 1 tablet in the afternoon by mouth 60 tablet 0   methylphenidate 36 MG PO CR tablet Take 1 tablet (36 mg total) by mouth daily with breakfast. 30 tablet 0   norgestimate-ethinyl estradiol (SPRINTEC 28) 0.25-35 MG-MCG tablet Take 1 tablet daily. Discard placebos  and take active pills for continuous cycling 112 tablet 4   spironolactone (ALDACTONE) 100 MG tablet Take 1 tablet (100 mg total) by mouth daily. 90 tablet 1   tretinoin (RETIN-A) 0.05 % cream Apply topically at bedtime. 45 g 6   Vitamin D, Ergocalciferol, (DRISDOL) 1.25 MG (50000 UNIT) CAPS capsule Take 1 capsule (50,000 Units total) by mouth every 7 (seven) days. 8 capsule 0   No current facility-administered medications on file prior to visit.    No Known Allergies   Physical Exam:    Vitals:   12/15/21 1332  BP: 122/79  Pulse: 88  Weight: 110 lb (49.9 kg)  Height: 5' 0.63" (1.54 m)    Growth percentile SmartLinks can only be used for patients less than 49 years old.  Physical Exam Vitals and nursing note reviewed.  Constitutional:      General: She is not in acute distress.    Appearance: She is well-developed.  Neck:     Thyroid: No thyromegaly.  Cardiovascular:     Rate and Rhythm: Normal rate and regular rhythm.     Heart sounds: No murmur heard. Pulmonary:     Breath sounds: Normal breath sounds.  Abdominal:     Palpations: Abdomen is soft. There is no mass.     Tenderness: There is no abdominal tenderness. There  is no guarding.  Musculoskeletal:     Right lower leg: No edema.     Left lower leg: No edema.  Lymphadenopathy:     Cervical: No cervical adenopathy.  Skin:    General: Skin is warm.     Capillary Refill: Capillary refill takes less than 2 seconds.     Findings: No rash.  Neurological:     General: No focal deficit present.     Mental Status: She is alert.     Comments: No tremor  Psychiatric:        Mood and Affect: Mood normal.    Assessment/Plan: 1. ADHD, predominantly inattentive type Continue concerta daily with ritalin in the PM as needed  - methylphenidate (CONCERTA) 36 MG PO CR tablet; Take 1 tablet (36 mg total) by mouth daily.  Dispense: 30 tablet; Refill: 0  2. Adjustment disorder with mixed anxiety and depressed mood Continue  duloxetine 40 mg daily  - DULoxetine HCl 40 MG CPEP; Take 40 mg by mouth daily.  Dispense: 90 capsule; Refill: 1  3. PCOS (polycystic ovarian syndrome) Continue nexplanon and spiro.   4. Elevated liver function tests Repeat LFTs today.  - Hepatic function panel  5. Vitamin D deficiency Repeat vit d today.  - VITAMIN D 25 Hydroxy (Vit-D Deficiency, Fractures)  6. Acne vulgaris Restart benzaclin daily with retin A. Has been through course of accutane and completed but does have some persistent acne on cheeks where mask makes contact with face.  - Clindamycin-Benzoyl Per, Refr, gel; Apply 1 application topically every morning.  Dispense: 45 g; Refill: 6   Return in 3 months or sooner as needed   Alfonso Ramus, FNP

## 2021-12-16 ENCOUNTER — Other Ambulatory Visit: Payer: Self-pay | Admitting: Pediatrics

## 2021-12-16 LAB — HEPATIC FUNCTION PANEL
AG Ratio: 1.7 (calc) (ref 1.0–2.5)
ALT: 21 U/L (ref 6–29)
AST: 14 U/L (ref 10–30)
Albumin: 4.7 g/dL (ref 3.6–5.1)
Alkaline phosphatase (APISO): 77 U/L (ref 31–125)
Bilirubin, Direct: 0.2 mg/dL (ref 0.0–0.2)
Globulin: 2.7 g/dL (calc) (ref 1.9–3.7)
Indirect Bilirubin: 0.8 mg/dL (calc) (ref 0.2–1.2)
Total Bilirubin: 1 mg/dL (ref 0.2–1.2)
Total Protein: 7.4 g/dL (ref 6.1–8.1)

## 2021-12-16 LAB — VITAMIN D 25 HYDROXY (VIT D DEFICIENCY, FRACTURES): Vit D, 25-Hydroxy: 16 ng/mL — ABNORMAL LOW (ref 30–100)

## 2021-12-16 MED ORDER — VITAMIN D (ERGOCALCIFEROL) 1.25 MG (50000 UNIT) PO CAPS: 50000.0000 [IU] | ORAL_CAPSULE | ORAL | 1 refills | Status: DC

## 2022-01-25 ENCOUNTER — Other Ambulatory Visit: Payer: Self-pay | Admitting: Pediatrics

## 2022-01-25 DIAGNOSIS — F900 Attention-deficit hyperactivity disorder, predominantly inattentive type: Secondary | ICD-10-CM

## 2022-01-25 MED ORDER — CONCERTA 36 MG PO TBCR: 36.0000 mg | EXTENDED_RELEASE_TABLET | Freq: Every day | ORAL | 0 refills | Status: DC

## 2022-02-22 ENCOUNTER — Other Ambulatory Visit: Payer: Self-pay | Admitting: Pediatrics

## 2022-02-22 DIAGNOSIS — L700 Acne vulgaris: Secondary | ICD-10-CM

## 2022-03-11 ENCOUNTER — Ambulatory Visit (INDEPENDENT_AMBULATORY_CARE_PROVIDER_SITE_OTHER): Payer: Medicaid Other | Admitting: Pediatrics

## 2022-03-11 ENCOUNTER — Encounter: Payer: Self-pay | Admitting: Pediatrics

## 2022-03-11 VITALS — BP 108/70 | HR 57 | Ht 61.0 in | Wt 114.4 lb

## 2022-03-11 DIAGNOSIS — E282 Polycystic ovarian syndrome: Secondary | ICD-10-CM | POA: Diagnosis not present

## 2022-03-11 DIAGNOSIS — Z3202 Encounter for pregnancy test, result negative: Secondary | ICD-10-CM | POA: Diagnosis not present

## 2022-03-11 DIAGNOSIS — R0981 Nasal congestion: Secondary | ICD-10-CM

## 2022-03-11 DIAGNOSIS — L700 Acne vulgaris: Secondary | ICD-10-CM | POA: Diagnosis not present

## 2022-03-11 DIAGNOSIS — F4323 Adjustment disorder with mixed anxiety and depressed mood: Secondary | ICD-10-CM | POA: Diagnosis not present

## 2022-03-11 DIAGNOSIS — F900 Attention-deficit hyperactivity disorder, predominantly inattentive type: Secondary | ICD-10-CM

## 2022-03-11 LAB — POCT URINE PREGNANCY: Preg Test, Ur: NEGATIVE

## 2022-03-11 MED ORDER — FLUTICASONE PROPIONATE 50 MCG/ACT NA SUSP: 2.0000 | Freq: Every day | NASAL | 4 refills | Status: DC

## 2022-03-11 MED ORDER — CONCERTA 36 MG PO TBCR: 36.0000 mg | EXTENDED_RELEASE_TABLET | Freq: Every day | ORAL | 0 refills | Status: DC

## 2022-03-11 MED ORDER — CETIRIZINE HCL 10 MG PO TABS: 10.0000 mg | ORAL_TABLET | Freq: Every day | ORAL | 3 refills | Status: DC

## 2022-03-11 MED ORDER — ISOTRETINOIN 20 MG PO CAPS: 20.0000 mg | ORAL_CAPSULE | Freq: Two times a day (BID) | ORAL | 0 refills | Status: DC

## 2022-03-11 MED ORDER — NORGESTIMATE-ETH ESTRADIOL 0.25-35 MG-MCG PO TABS: ORAL_TABLET | ORAL | 11 refills | Status: DC

## 2022-03-11 NOTE — Progress Notes (Signed)
I have reviewed the resident's note and plan of care and helped develop the plan as necessary. ? ?We have tried numerous different interventions for Colleen Gordon's acne s/p accutane treatment in 2020. Records reviewed- she only ever achieved 126 mg/kg total dose so suspect if we repeat to 150 mg/kg total she will have better response. Ipledge updated and restarted today. She was able to log in today in clinic. Will repeat LFTs and trigs at next visit- has had them recently and were normal.  ? ?Return in 1 month for accutane f/u.  ? ?Alfonso Ramus, FNP ? ?

## 2022-03-11 NOTE — Progress Notes (Signed)
THIS RECORD MAY CONTAIN CONFIDENTIAL INFORMATION THAT SHOULD NOT BE RELEASED WITHOUT REVIEW OF THE SERVICE PROVIDER. ? ?Adolescent Medicine Consultation Follow-Up Visit ?Colleen Gordon  is a 21 y.o. female referred by [name], NP here today for follow-up regarding PCOS, acne, ADHD, anxiety, and depression.   ? ?Supervising physician: Dr. Delorse Lek  ? ?Plan at last adolescent specialty clinic visit included: ? - ADHD: continue Concerta 36 mg daily with Ritalin in PM PRN ? - Adjustment disorder: continue Duloxetine 40 mg daily ? - PCOS: continue nexplanon and spirolactone, Sprintec ? - Acne: restart Benzaclin daily, continue retin A ? ?Pertinent Labs? No ?Growth Chart Viewed? yes ? ? History was provided by the patient. ? ?Interpreter? no ? ?Chief complaint: Acne ? ?HPI:   ?PCP Confirmed?  yes ? ?Feels that not much has changed since last appointment and skin is worse. Having worse scaring. Having more breakouts than before. In general has more whiteheads than blackheads. Acne is worse with diet and stress. Menstrual cycle is irregular. Not having many cramps. Occasionally has acne along scalp, chest. None on back. ? ?Uses Benzaclin 2-3 times per week. Uses Retin A 5-6 nights a week. Washes and moisturizes with CereVe products at least once a day. Uses pimple patches on occasion.  ? ?Skin gets more oily depending on diet, which Zyanna feels impacts acne. Eating out more increases acne. Feels stress is more manageable this semester. ? ?Had stopped taking Concerta for a while when she got the generic because she was having more nasuea, but has brand name now and has much less nausea. Still gets some 4-6 hours after taking it.  ? ?No side effects from Duloxetine. Mood stable. ? ?My Chart Activated?   yes  ?Patient's personal or confidential phone number: [phone] ? ?No LMP recorded. (Menstrual status: Irregular Periods). More than 1 month ago. ?No Known Allergies ?Current Outpatient Medications on File Prior to  Visit  ?Medication Sig Dispense Refill  ? Clindamycin-Benzoyl Per, Refr, gel Apply 1 application topically every morning. 45 g 6  ? DULoxetine HCl 40 MG CPEP Take 40 mg by mouth daily. 90 capsule 1  ? methylphenidate (RITALIN) 5 MG tablet Take 1 tablet in the afternoon by mouth 60 tablet 0  ? spironolactone (ALDACTONE) 100 MG tablet TAKE 1 TABLET(100 MG) BY MOUTH DAILY 90 tablet 1  ? tretinoin (RETIN-A) 0.05 % cream Apply topically at bedtime. 45 g 6  ? Vitamin D, Ergocalciferol, (DRISDOL) 1.25 MG (50000 UNIT) CAPS capsule Take 1 capsule (50,000 Units total) by mouth every 7 (seven) days. 8 capsule 1  ? [DISCONTINUED] methylphenidate (CONCERTA) 36 MG PO CR tablet Take 1 tablet (36 mg total) by mouth daily. 30 tablet 0  ? ?No current facility-administered medications on file prior to visit.  ? ? ?Patient Active Problem List  ? Diagnosis Date Noted  ? Vitamin D deficiency 12/15/2021  ? Elevated liver function tests 12/15/2021  ? ADHD, predominantly inattentive type 05/27/2021  ? Sleep disturbance 11/24/2020  ? Adjustment disorder with mixed anxiety and depressed mood 11/15/2018  ? Breakthrough bleeding on Nexplanon 11/15/2018  ? Acne vulgaris 11/15/2018  ? PCOS (polycystic ovarian syndrome) 10/31/2018  ? ? ? ?Activities:  ?Special interests/hobbies/sports: spending time with friends, go out to eat, shop, [employer] ? ?Lifestyle habits that can impact QOL: ?Sleep: having some trouble falling asleep, sleeps 7-8 hours a night ?Eating habits/patterns: see above ?Water intake: Less than 1 plastic bottle ?Body Movement: walks to class ? ?Confidentiality was discussed  with the patient and if applicable, with caregiver as well. ? ?Changes at home or school since last visit:  no ? ?Tobacco?  no ?Drugs/ETOH?  no ?Partner preference?  female  ?Sexually Active?  yes; condoms and implant ? ?Suicidal or homicidal thoughts?   no ?Self injurious behaviors?  no ? ? ?Physical Exam:  ?Vitals:  ? 03/11/22 0837  ?BP: 108/70  ?Pulse: (!) 57   ?Weight: 114 lb 6.4 oz (51.9 kg)  ?Height: 5\' 1"  (1.549 m)  ? ?BP 108/70   Pulse (!) 57   Ht 5\' 1"  (1.549 m)   Wt 114 lb 6.4 oz (51.9 kg)   BMI 21.62 kg/m?  ?Body mass index: body mass index is 21.62 kg/m? ?Growth percentile SmartLinks can only be used for patients less than 36 years old. ? ?Physical Exam ?Vitals reviewed.  ?Constitutional:   ?   General: She is not in acute distress. ?   Appearance: Normal appearance.  ?HENT:  ?   Head: Normocephalic.  ?   Right Ear: External ear normal.  ?   Left Ear: External ear normal.  ?   Nose: Nose normal.  ?   Mouth/Throat:  ?   Mouth: Mucous membranes are moist.  ?   Pharynx: Oropharynx is clear.  ?Eyes:  ?   Extraocular Movements: Extraocular movements intact.  ?   Conjunctiva/sclera: Conjunctivae normal.  ?   Pupils: Pupils are equal, round, and reactive to light.  ?Cardiovascular:  ?   Rate and Rhythm: Normal rate and regular rhythm.  ?   Pulses: Normal pulses.  ?   Heart sounds: Normal heart sounds.  ?Pulmonary:  ?   Effort: Pulmonary effort is normal.  ?   Breath sounds: Normal breath sounds.  ?Abdominal:  ?   General: Abdomen is flat. Bowel sounds are normal.  ?   Palpations: Abdomen is soft.  ?Musculoskeletal:     ?   General: Normal range of motion.  ?   Cervical back: Normal range of motion and neck supple.  ?   Comments: Nexplanon palpated within mid-upper arm  ?Lymphadenopathy:  ?   Cervical: No cervical adenopathy.  ?Skin: ?   General: Skin is warm.  ?   Capillary Refill: Capillary refill takes less than 2 seconds.  ?   Comments: Oily skin on face, non-cystic acne on forehead, cheeks with hyperpigmented scarring  ?Neurological:  ?   General: No focal deficit present.  ?   Mental Status: She is alert and oriented to person, place, and time. Mental status is at baseline.  ?Psychiatric:     ?   Mood and Affect: Mood normal.     ?   Behavior: Behavior normal.     ?   Thought Content: Thought content normal.  ? ? ?Assessment/Plan: ?1. Acne vulgaris ?Continue  Benzaclin, stop Retin A, will restart Accutane due to persistence of acne with scarring and oily skin. Obtained urine pregnancy in clinic today prior to starting. ? ?- POCT urine pregnancy ?- Restart Accutane 20 mg BID ?- Stop Retin A ? ?2. PCOS (polycystic ovarian syndrome) ?Continue Nexplanon, Sprintec for breakthrough bleeding, spirinolactone ? ?3. ADHD, predominantly inattentive type ?Provided refill. Plan to continue Concerta at 36 mg daily. Ritalin in PM PRN. ?- CONCERTA 36 MG CR tablet; Take 1 tablet (36 mg total) by mouth daily.  Dispense: 30 tablet; Refill: 0 ? ?4. Adjustment disorder with mixed anxiety and depressed mood ?Continue Duloxetine 40 mg ? ?5. Congestion of nasal sinuses ?  Per dentist, Yeira's sinuses on recent dental XR were enlarged. They recommended referral to ENT. Instead, we will try using Zyrtec daily and Flonase daily to see if [name] feels improvement in breathing. She does not note any difficulty breathing or feeling of congestion. ? ?BH screenings:  ?PHQ-SADS Last 3 Score only 03/11/2022 12/15/2021 08/25/2021  ?PHQ-15 Score 3 4 6   ?Total GAD-7 Score 4 3 3   ?PHQ Adolescent Score 5 3 4   ?  ?Screens performed during this visit were discussed with patient and adjustments to plan made accordingly.  ? ?Follow-up:  Return in about 1 month (around 04/11/2022) for Accutane f/u.  ? ?Ladona MowPaige Jaisen Wiltrout, MD ?03/11/2022 9:35 AM ?Pediatrics ?PGY-1  ? ?

## 2022-03-11 NOTE — Patient Instructions (Addendum)
Start taking accutane again 20 mg twice daily  ?Stop tretinoin  ?Continue concerta and cymbalta  ?Use good sunscreen and moisturizer  ?We will see you in 1 month for follow up and labs!  ?

## 2022-04-07 ENCOUNTER — Encounter (HOSPITAL_COMMUNITY): Payer: Self-pay | Admitting: Emergency Medicine

## 2022-04-07 ENCOUNTER — Emergency Department (HOSPITAL_COMMUNITY): Payer: Medicaid Other

## 2022-04-07 ENCOUNTER — Emergency Department (HOSPITAL_COMMUNITY)
Admission: EM | Admit: 2022-04-07 | Discharge: 2022-04-07 | Disposition: A | Discharge status: Home / Self Care | Payer: Medicaid Other | Attending: Emergency Medicine | Admitting: Emergency Medicine

## 2022-04-07 DIAGNOSIS — S299XXA Unspecified injury of thorax, initial encounter: Secondary | ICD-10-CM | POA: Insufficient documentation

## 2022-04-07 DIAGNOSIS — R0789 Other chest pain: Secondary | ICD-10-CM | POA: Diagnosis not present

## 2022-04-07 DIAGNOSIS — R079 Chest pain, unspecified: Secondary | ICD-10-CM | POA: Diagnosis not present

## 2022-04-07 DIAGNOSIS — Y92410 Unspecified street and highway as the place of occurrence of the external cause: Secondary | ICD-10-CM | POA: Insufficient documentation

## 2022-04-07 MED ORDER — METHOCARBAMOL 500 MG PO TABS
500.0000 mg | ORAL_TABLET | Freq: Two times a day (BID) | ORAL | 0 refills | Status: DC
Start: 2022-04-07 — End: 2022-04-19

## 2022-04-07 MED ORDER — ACETAMINOPHEN 500 MG PO TABS
1000.0000 mg | ORAL_TABLET | Freq: Once | ORAL | Status: AC
  Administered 2022-04-07: 1000 mg via ORAL
  Filled 2022-04-07: qty 2
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
## 2022-04-08 ENCOUNTER — Ambulatory Visit (INDEPENDENT_AMBULATORY_CARE_PROVIDER_SITE_OTHER): Payer: Medicaid Other | Admitting: Pediatrics

## 2022-04-08 ENCOUNTER — Encounter: Payer: Self-pay | Admitting: Pediatrics

## 2022-04-08 ENCOUNTER — Other Ambulatory Visit: Payer: Self-pay | Admitting: Pediatrics

## 2022-04-08 VITALS — BP 111/82 | HR 69 | Ht 61.0 in | Wt 110.2 lb

## 2022-04-08 DIAGNOSIS — F900 Attention-deficit hyperactivity disorder, predominantly inattentive type: Secondary | ICD-10-CM | POA: Diagnosis not present

## 2022-04-08 DIAGNOSIS — Z3202 Encounter for pregnancy test, result negative: Secondary | ICD-10-CM | POA: Diagnosis not present

## 2022-04-08 DIAGNOSIS — L700 Acne vulgaris: Secondary | ICD-10-CM | POA: Diagnosis not present

## 2022-04-08 DIAGNOSIS — Z5181 Encounter for therapeutic drug level monitoring: Secondary | ICD-10-CM | POA: Diagnosis not present

## 2022-04-08 DIAGNOSIS — F4323 Adjustment disorder with mixed anxiety and depressed mood: Secondary | ICD-10-CM

## 2022-04-08 DIAGNOSIS — E282 Polycystic ovarian syndrome: Secondary | ICD-10-CM

## 2022-04-08 LAB — POCT URINE PREGNANCY: Preg Test, Ur: NEGATIVE

## 2022-04-08 MED ORDER — ISOTRETINOIN 30 MG PO CAPS: 30.0000 mg | ORAL_CAPSULE | Freq: Every day | ORAL | 0 refills | Status: DC

## 2022-04-08 MED ORDER — ISOTRETINOIN 20 MG PO CAPS: 20.0000 mg | ORAL_CAPSULE | Freq: Every day | ORAL | 0 refills | Status: DC
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
## 2022-04-08 NOTE — Patient Instructions (Signed)
Continue accutane- we will increase to 50 mg daily (30 in the AM and 20 in the PM)  ?Pick up the robaxin and take today for soreness. Do some gentle movement, can use ice and heat alternating if needed  ?Let us know if not getting better!  ?

## 2022-04-09 LAB — HEPATIC FUNCTION PANEL
AG Ratio: 1.8 (calc) (ref 1.0–2.5)
ALT: 18 U/L (ref 6–29)
AST: 15 U/L (ref 10–30)
Albumin: 4.6 g/dL (ref 3.6–5.1)
Alkaline phosphatase (APISO): 79 U/L (ref 31–125)
Bilirubin, Direct: 0.2 mg/dL (ref 0.0–0.2)
Globulin: 2.5 g/dL (calc) (ref 1.9–3.7)
Indirect Bilirubin: 0.6 mg/dL (calc) (ref 0.2–1.2)
Total Bilirubin: 0.8 mg/dL (ref 0.2–1.2)
Total Protein: 7.1 g/dL (ref 6.1–8.1)

## 2022-04-09 LAB — TRIGLYCERIDES: Triglycerides: 73 mg/dL (ref <150)

## 2022-04-19 ENCOUNTER — Other Ambulatory Visit: Payer: Self-pay | Admitting: Pediatrics

## 2022-04-19 DIAGNOSIS — R0781 Pleurodynia: Secondary | ICD-10-CM

## 2022-04-19 MED ORDER — MELOXICAM 15 MG PO TABS: 15.0000 mg | ORAL_TABLET | Freq: Every day | ORAL | 0 refills | Status: DC

## 2022-04-19 MED ORDER — METHOCARBAMOL 500 MG PO TABS: 500.0000 mg | ORAL_TABLET | Freq: Two times a day (BID) | ORAL | 0 refills | Status: DC

## 2022-05-06 ENCOUNTER — Other Ambulatory Visit: Payer: Self-pay | Admitting: Pediatrics

## 2022-05-06 ENCOUNTER — Ambulatory Visit (INDEPENDENT_AMBULATORY_CARE_PROVIDER_SITE_OTHER): Payer: Medicaid Other

## 2022-05-06 DIAGNOSIS — Z3202 Encounter for pregnancy test, result negative: Secondary | ICD-10-CM | POA: Diagnosis not present

## 2022-05-06 DIAGNOSIS — L700 Acne vulgaris: Secondary | ICD-10-CM

## 2022-05-06 LAB — POCT URINE PREGNANCY: Preg Test, Ur: NEGATIVE

## 2022-05-06 MED ORDER — ISOTRETINOIN 20 MG PO CAPS: 20.0000 mg | ORAL_CAPSULE | Freq: Every day | ORAL | 0 refills | Status: DC

## 2022-05-06 MED ORDER — ISOTRETINOIN 30 MG PO CAPS: 30.0000 mg | ORAL_CAPSULE | Freq: Every day | ORAL | 0 refills | Status: DC

## 2022-05-06 MED ORDER — METHYLPHENIDATE HCL ER (OSM) 18 MG PO TBCR: 18.0000 mg | EXTENDED_RELEASE_TABLET | Freq: Every day | ORAL | 0 refills | Status: DC

## 2022-05-06 NOTE — Progress Notes (Signed)
Pt here today for urine pregnancy for Accutane. Collaborated with NP. Sending to provider to update iPledge and refill. ? ?
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
## 2022-06-03 ENCOUNTER — Other Ambulatory Visit: Payer: Self-pay | Admitting: Pediatrics

## 2022-06-03 ENCOUNTER — Ambulatory Visit: Payer: Medicaid Other | Admitting: Pediatrics

## 2022-06-03 ENCOUNTER — Ambulatory Visit (INDEPENDENT_AMBULATORY_CARE_PROVIDER_SITE_OTHER): Payer: Medicaid Other | Admitting: Pediatrics

## 2022-06-03 VITALS — BP 107/68 | Ht 61.0 in | Wt 110.6 lb

## 2022-06-03 DIAGNOSIS — L700 Acne vulgaris: Secondary | ICD-10-CM

## 2022-06-03 DIAGNOSIS — Z3202 Encounter for pregnancy test, result negative: Secondary | ICD-10-CM | POA: Diagnosis not present

## 2022-06-03 LAB — POCT URINE PREGNANCY: Preg Test, Ur: NEGATIVE

## 2022-06-03 MED ORDER — ISOTRETINOIN 30 MG PO CAPS: 30.0000 mg | ORAL_CAPSULE | Freq: Every day | ORAL | 0 refills | Status: DC

## 2022-06-03 MED ORDER — ISOTRETINOIN 20 MG PO CAPS: 20.0000 mg | ORAL_CAPSULE | Freq: Every day | ORAL | 0 refills | Status: DC

## 2022-06-03 NOTE — Progress Notes (Signed)
Presented for urine preg for accutane. Obtained and negative. Ipledge updated. Will see back for visit in 1 month.   Alfonso Ramus, FNP

## 2022-07-20 ENCOUNTER — Other Ambulatory Visit: Payer: Self-pay | Admitting: Pediatrics

## 2022-07-20 ENCOUNTER — Ambulatory Visit (INDEPENDENT_AMBULATORY_CARE_PROVIDER_SITE_OTHER): Payer: Medicaid Other | Admitting: Pediatrics

## 2022-07-20 ENCOUNTER — Encounter: Payer: Self-pay | Admitting: Pediatrics

## 2022-07-20 VITALS — BP 98/67 | HR 83 | Ht 60.63 in | Wt 108.0 lb

## 2022-07-20 DIAGNOSIS — F900 Attention-deficit hyperactivity disorder, predominantly inattentive type: Secondary | ICD-10-CM | POA: Diagnosis not present

## 2022-07-20 DIAGNOSIS — F4323 Adjustment disorder with mixed anxiety and depressed mood: Secondary | ICD-10-CM

## 2022-07-20 DIAGNOSIS — L700 Acne vulgaris: Secondary | ICD-10-CM | POA: Diagnosis not present

## 2022-07-20 DIAGNOSIS — Z3202 Encounter for pregnancy test, result negative: Secondary | ICD-10-CM

## 2022-07-20 LAB — POCT URINE PREGNANCY: Preg Test, Ur: NEGATIVE

## 2022-07-20 MED ORDER — DULOXETINE HCL 40 MG PO CPEP: 40.0000 mg | ORAL_CAPSULE | Freq: Every day | ORAL | 1 refills | Status: DC

## 2022-07-20 MED ORDER — METHYLPHENIDATE HCL ER (OSM) 27 MG PO TBCR: 27.0000 mg | EXTENDED_RELEASE_TABLET | Freq: Every day | ORAL | 0 refills | Status: DC

## 2022-07-20 NOTE — Progress Notes (Unsigned)
History was provided by the {[email or web address]:[id number]}.  Colleen Gordon is a 21 y.o. female who is here for ***.  Verneda Skill, FNP   HPI:  Pt reports hasn't been taking accutane as regularly as she should have but skin is clearing pretty well. Misses a few doses a week.   Other meds have been good- pt feels like 27 mg of concerta would be right.   No LMP recorded. (Menstrual status: Irregular Periods).  ROS  Patient Active Problem List   Diagnosis Date Noted   Vitamin D deficiency 12/15/2021   Elevated liver function tests 12/15/2021   ADHD, predominantly inattentive type 05/27/2021   Sleep disturbance 11/24/2020   Adjustment disorder with mixed anxiety and depressed mood 11/15/2018   Breakthrough bleeding on Nexplanon 11/15/2018   Acne vulgaris 11/15/2018   PCOS (polycystic ovarian syndrome) 10/31/2018    Current Outpatient Medications on File Prior to Visit  Medication Sig Dispense Refill   cetirizine (ZYRTEC) 10 MG tablet Take 1 tablet (10 mg total) by mouth daily. 30 tablet 3   Clindamycin-Benzoyl Per, Refr, gel Apply 1 application topically every morning. 45 g 6   DULoxetine HCl 40 MG CPEP Take 40 mg by mouth daily. 90 capsule 1   fluticasone (FLONASE) 50 MCG/ACT nasal spray Place 2 sprays into both nostrils daily. 16 g 4   ISOtretinoin (ACCUTANE) 20 MG capsule Take 1 capsule (20 mg total) by mouth daily. 30 capsule 0   ISOtretinoin (ACCUTANE) 30 MG capsule Take 1 capsule (30 mg total) by mouth daily. 30 capsule 0   methylphenidate (CONCERTA) 18 MG PO CR tablet Take 1 tablet (18 mg total) by mouth daily. 30 tablet 0   SPRINTEC 28 0.25-35 MG-MCG tablet Take by mouth.     Vitamin D, Ergocalciferol, (DRISDOL) 1.25 MG (50000 UNIT) CAPS capsule Take 1 capsule (50,000 Units total) by mouth every 7 (seven) days. 8 capsule 1   [DISCONTINUED] methylphenidate (CONCERTA) 36 MG PO CR tablet Take 1 tablet (36 mg total) by mouth daily. 30 tablet 0   No current facility-administered  medications on file prior to visit.    No Known Allergies  Social History: Confidentiality was discussed with the patient and if applicable, with caregiver as well. Tobacco: *** Secondhand smoke exposure? {yes***/no:17258} Drugs/EtOH: *** Sexually active? {yes***/no:17258}  Safety: *** Last STI Screening:*** Pregnancy Prevention: ***  Physical Exam:    Vitals:   07/20/22 1450  BP: 98/67  Pulse: 83  Weight: 108 lb (49 kg)  Height: 5' 0.63" (1.54 m)    Growth %ile SmartLinks can only be used for patients less than 13 years old.  Physical Exam  Assessment/Plan: ***

## 2022-07-20 NOTE — Patient Instructions (Signed)
Finish accutane and then stop when done  Increase concerta to 27 mg daily

## 2022-08-09 ENCOUNTER — Encounter: Payer: Self-pay | Admitting: Pediatrics

## 2022-10-21 ENCOUNTER — Encounter: Payer: Self-pay | Admitting: Family

## 2022-10-21 ENCOUNTER — Ambulatory Visit (INDEPENDENT_AMBULATORY_CARE_PROVIDER_SITE_OTHER): Payer: Medicaid Other | Admitting: Family

## 2022-10-21 VITALS — BP 98/67 | HR 76 | Ht 60.63 in | Wt 108.0 lb

## 2022-10-21 DIAGNOSIS — F900 Attention-deficit hyperactivity disorder, predominantly inattentive type: Secondary | ICD-10-CM

## 2022-10-21 DIAGNOSIS — Z975 Presence of (intrauterine) contraceptive device: Secondary | ICD-10-CM | POA: Insufficient documentation

## 2022-10-21 DIAGNOSIS — F4323 Adjustment disorder with mixed anxiety and depressed mood: Secondary | ICD-10-CM

## 2022-10-21 MED ORDER — DULOXETINE HCL 20 MG PO CPEP: 20.0000 mg | ORAL_CAPSULE | Freq: Every day | ORAL | 0 refills | Status: DC

## 2022-10-21 MED ORDER — METHYLPHENIDATE HCL ER (OSM) 27 MG PO TBCR: 27.0000 mg | EXTENDED_RELEASE_TABLET | Freq: Every day | ORAL | 0 refills | Status: DC

## 2022-10-21 NOTE — Progress Notes (Signed)
History was provided by the patient and boyfriend .  Colleen Gordon is a 21 y.o. female who is here for adjustment disorder with mixed anxiety and depressed mood and ADHD, predominately inattentive type.   PCP confirmed? Needs referral to ADULT   Plan from last visit:  1. ADHD, predominantly inattentive type Will increase concerta to 27 mg daily.  - methylphenidate 27 MG PO CR tablet; Take 1 tablet (27 mg total) by mouth daily with breakfast.  Dispense: 30 tablet; Refill: 0   2. Adjustment disorder with mixed anxiety and depressed mood Continue duloxetine 40 mg  - DULoxetine HCl 40 MG CPEP; Take 40 mg by mouth daily.  Dispense: 90 capsule; Refill: 1   3. Pregnancy examination or test, negative result Neg- entered into ipledge  - POCT urine pregnancy   4. Acne vulgaris Will finish medication she has and then accutane therapy will be completed. She is in agreement. Continues with nexplanon and condoms for contraception. Will need final upreg entered into ipledge after 8/24.      HPI:    -trialed off Cymbalta  -has been months off, wants to restart  -needs Concerta restart  -taking birth control pills;  -intetrested in [place] for PCP estabnlish -also therapy referral  -nexplanon in place, uses OCPs for breakthrough bleeding      10/22/2022    6:00 PM 03/11/2022    9:24 AM 03/11/2022    9:22 AM  PHQ-SADS Last 3 Score only  PHQ-15 Score 7  3  Total GAD-7 Score 8 4   PHQ Adolescent Score 5  5   ASRS Completed on 10/22/22 Part A:  3/6 Part B:  6/12   Patient Active Problem List   Diagnosis Date Noted   Vitamin D deficiency 12/15/2021   Elevated liver function tests 12/15/2021   ADHD, predominantly inattentive type 05/27/2021   Sleep disturbance 11/24/2020   Adjustment disorder with mixed anxiety and depressed mood 11/15/2018   Breakthrough bleeding on Nexplanon 11/15/2018   Acne vulgaris 11/15/2018   PCOS (polycystic ovarian syndrome) 10/31/2018    Current  Outpatient Medications on File Prior to Visit  Medication Sig Dispense Refill   cetirizine (ZYRTEC) 10 MG tablet Take 1 tablet (10 mg total) by mouth daily. 30 tablet 3   DULoxetine HCl 40 MG CPEP Take 40 mg by mouth daily. 90 capsule 1   fluticasone (FLONASE) 50 MCG/ACT nasal spray Place 2 sprays into both nostrils daily. 16 g 4   methylphenidate 27 MG PO CR tablet Take 1 tablet (27 mg total) by mouth daily with breakfast. 30 tablet 0   SPRINTEC 28 0.25-35 MG-MCG tablet TAKE 1 TABLET DAILY. DISCARD PLACEBOS AND TAKE ACTIVE PILLS FOR CONTINUOUS CYCLING. 112 tablet 1   Vitamin D, Ergocalciferol, (DRISDOL) 1.25 MG (50000 UNIT) CAPS capsule Take 1 capsule (50,000 Units total) by mouth every 7 (seven) days. 8 capsule 1   [DISCONTINUED] methylphenidate (CONCERTA) 36 MG PO CR tablet Take 1 tablet (36 mg total) by mouth daily. 30 tablet 0   Clindamycin-Benzoyl Per, Refr, gel Apply 1 application topically every morning. (Patient not taking: Reported on 10/21/2022) 45 g 6   No current facility-administered medications on file prior to visit.    No Known Allergies  Physical Exam:    Vitals:   10/21/22 1558  BP: 98/67  Pulse: 76  Weight: 108 lb (49 kg)  Height: 5' 0.63" (1.54 m)    Growth %ile SmartLinks can only be used for patients less than 20 years  old. No LMP recorded. (Menstrual status: Irregular Periods).  Physical Exam Constitutional:      General: She is not in acute distress.    Appearance: She is well-developed.  HENT:     Head: Normocephalic and atraumatic.  Eyes:     General: No scleral icterus.    Pupils: Pupils are equal, round, and reactive to light.  Neck:     Thyroid: No thyromegaly.  Cardiovascular:     Rate and Rhythm: Normal rate and regular rhythm.     Heart sounds: Normal heart sounds. No murmur heard. Pulmonary:     Effort: Pulmonary effort is normal.     Breath sounds: Normal breath sounds.  Musculoskeletal:        General: Normal range of motion.      Cervical back: Normal range of motion and neck supple.  Lymphadenopathy:     Cervical: No cervical adenopathy.  Skin:    General: Skin is warm and dry.     Findings: No rash.  Neurological:     Mental Status: She is alert and oriented to person, place, and time.     Cranial Nerves: No cranial nerve deficit.     Motor: No tremor.  Psychiatric:        Attention and Perception: Attention normal.        Mood and Affect: Mood is anxious.        Speech: Speech normal.        Behavior: Behavior normal.        Thought Content: Thought content normal.        Judgment: Judgment normal.      Assessment/Plan: 1. Adjustment disorder with mixed anxiety and depressed mood -restart duloxetine 20 mg  -return in 2 weeks video for medication follow-up   2. ADHD, predominantly inattentive type -restart medications  - methylphenidate 27 MG PO CR tablet; Take 1 tablet (27 mg total) by mouth daily with breakfast.  Dispense: 30 tablet; Refill: 0  3. Nexplanon in place -return precautions reviewed

## 2022-10-22 ENCOUNTER — Encounter: Payer: Self-pay | Admitting: Family

## 2022-11-03 ENCOUNTER — Encounter: Payer: Self-pay | Admitting: Family

## 2022-11-03 ENCOUNTER — Telehealth (INDEPENDENT_AMBULATORY_CARE_PROVIDER_SITE_OTHER): Payer: Medicaid Other | Admitting: Family

## 2022-11-03 DIAGNOSIS — F900 Attention-deficit hyperactivity disorder, predominantly inattentive type: Secondary | ICD-10-CM | POA: Diagnosis not present

## 2022-11-03 DIAGNOSIS — F4323 Adjustment disorder with mixed anxiety and depressed mood: Secondary | ICD-10-CM | POA: Diagnosis not present

## 2022-11-03 MED ORDER — LISDEXAMFETAMINE DIMESYLATE 10 MG PO CAPS: 10.0000 mg | ORAL_CAPSULE | Freq: Every day | ORAL | 0 refills | Status: DC

## 2022-11-03 NOTE — Progress Notes (Signed)
THIS RECORD MAY CONTAIN CONFIDENTIAL INFORMATION THAT SHOULD NOT BE RELEASED WITHOUT REVIEW OF THE SERVICE PROVIDER.  Virtual Follow-Up Visit via Video Note  I connected with Colleen Gordon  on 11/03/22 at  3:30 PM EST by a video enabled telemedicine application and verified that I am speaking with the correct person using two identifiers.   Patient/parent location: car  Provider location: remote [place]    I discussed the limitations of evaluation and management by telemedicine and the availability of in person appointments.  I discussed that the purpose of this telehealth visit is to provide medical care while limiting exposure to the novel coronavirus.  The patient expressed understanding and agreed to proceed.   Colleen Gordon is a 21 y.o. female referred by No ref. provider found here today for follow-up of adjustment disorder with mixed anxiety and depressed mood, ADHD, predominately inattentive type.   History was provided by the patient.  Supervising Physician: Dr. Roselind Messier   Plan from Last Visit:   1. Adjustment disorder with mixed anxiety and depressed mood -restart duloxetine 20 mg  -return in 2 weeks video for medication follow-up    2. ADHD, predominantly inattentive type -restart medications  - methylphenidate 27 MG PO CR tablet; Take 1 tablet (27 mg total) by mouth daily with breakfast.  Dispense: 30 tablet; Refill: 0   3. Nexplanon in place -return precautions reviewed   Chief Complaint: Nausea  Lack of appetite   History of Present Illness:  Duloxetine, a few missed doses - can't really tell much difference yet  11AM takes both medicines  Usually notices Concerta wearing off around 8PM  Not really able to eat on medicine  No headaches, no chest pain; mood otherwise stable  No SI/HI   No Known Allergies Outpatient Medications Prior to Visit  Medication Sig Dispense Refill   cetirizine (ZYRTEC) 10 MG tablet Take 1 tablet (10 mg total) by mouth daily. 30  tablet 3   Clindamycin-Benzoyl Per, Refr, gel Apply 1 application topically every morning. (Patient not taking: Reported on 10/21/2022) 45 g 6   DULoxetine (CYMBALTA) 20 MG capsule Take 1 capsule (20 mg total) by mouth daily. 30 capsule 0   fluticasone (FLONASE) 50 MCG/ACT nasal spray Place 2 sprays into both nostrils daily. 16 g 4   methylphenidate 27 MG PO CR tablet Take 1 tablet (27 mg total) by mouth daily with breakfast. 30 tablet 0   SPRINTEC 28 0.25-35 MG-MCG tablet TAKE 1 TABLET DAILY. DISCARD PLACEBOS AND TAKE ACTIVE PILLS FOR CONTINUOUS CYCLING. 112 tablet 1   Vitamin D, Ergocalciferol, (DRISDOL) 1.25 MG (50000 UNIT) CAPS capsule Take 1 capsule (50,000 Units total) by mouth every 7 (seven) days. 8 capsule 1   No facility-administered medications prior to visit.     Patient Active Problem List   Diagnosis Date Noted   Nexplanon in place 10/21/2022   Vitamin D deficiency 12/15/2021   Elevated liver function tests 12/15/2021   ADHD, predominantly inattentive type 05/27/2021   Sleep disturbance 11/24/2020   Adjustment disorder with mixed anxiety and depressed mood 11/15/2018   Breakthrough bleeding on Nexplanon 11/15/2018   Acne vulgaris 11/15/2018   PCOS (polycystic ovarian syndrome) 10/31/2018   The following portions of the patient's history were reviewed and updated as appropriate: allergies, current medications, and problem list.  Visual Observations/Objective:   General Appearance: Well nourished well developed, in no apparent distress.  Eyes: conjunctiva no swelling or erythema ENT/Mouth: No hoarseness, No cough for duration of visit.  Neck:  Supple  Respiratory: Respiratory effort normal, normal rate, no retractions or distress.   Cardio: Appears well-perfused, noncyanotic Musculoskeletal: no obvious deformity Skin: visible skin without rashes, ecchymosis, erythema Neuro: Awake and oriented X 3,  Psych:  normal affect, Insight and Judgment appropriate.     Assessment/Plan:  1. ADHD, predominantly inattentive type -not tolerating Concerta - persistent nausea late onset and no appetite  -discussed change to different class for trial of Vyvanse 10 mg  -agreeable to plan; PDMP reviewed; qty 10 sent  -will advise via My Chart how she is doing after a few doses   - lisdexamfetamine (VYVANSE) 10 MG capsule; Take 1 capsule (10 mg total) by mouth daily.  Dispense: 10 capsule; Refill: 0  2. Adjustment disorder with mixed anxiety and depressed mood -continue duloxetine 20 mg; reassess with PHQSADS at next visit  -follow up in 2-4 weeks pending ADHD trial    I discussed the assessment and treatment plan with the patient and/or parent/guardian.  They were provided an opportunity to ask questions and all were answered.  They agreed with the plan and demonstrated an understanding of the instructions. They were advised to call back or seek an in-person evaluation in the emergency room if the symptoms worsen or if the condition fails to improve as anticipated.   Follow-up:   pending My Chart   Georges Mouse, NP    CC: No primary care provider on file., No ref. provider found

## 2022-11-22 ENCOUNTER — Other Ambulatory Visit: Payer: Self-pay | Admitting: Family

## 2022-11-22 ENCOUNTER — Encounter: Payer: Self-pay | Admitting: Family

## 2022-11-22 DIAGNOSIS — F900 Attention-deficit hyperactivity disorder, predominantly inattentive type: Secondary | ICD-10-CM

## 2022-11-22 MED ORDER — LISDEXAMFETAMINE DIMESYLATE 20 MG PO CAPS: 20.0000 mg | ORAL_CAPSULE | Freq: Every day | ORAL | 0 refills | Status: DC

## 2022-11-29 ENCOUNTER — Other Ambulatory Visit: Payer: Self-pay | Admitting: Family

## 2022-12-25 ENCOUNTER — Other Ambulatory Visit: Payer: Self-pay | Admitting: Family

## 2022-12-25 DIAGNOSIS — F900 Attention-deficit hyperactivity disorder, predominantly inattentive type: Secondary | ICD-10-CM

## 2022-12-25 MED ORDER — LISDEXAMFETAMINE DIMESYLATE 20 MG PO CAPS: 20.0000 mg | ORAL_CAPSULE | Freq: Every day | ORAL | 0 refills | Status: DC

## 2023-02-08 ENCOUNTER — Other Ambulatory Visit: Payer: Self-pay | Admitting: Family

## 2023-02-08 DIAGNOSIS — F900 Attention-deficit hyperactivity disorder, predominantly inattentive type: Secondary | ICD-10-CM

## 2023-02-08 MED ORDER — LISDEXAMFETAMINE DIMESYLATE 20 MG PO CAPS: 20.0000 mg | ORAL_CAPSULE | Freq: Every day | ORAL | 0 refills | Status: DC

## 2023-03-09 ENCOUNTER — Other Ambulatory Visit: Payer: Self-pay | Admitting: Family

## 2023-03-09 DIAGNOSIS — F900 Attention-deficit hyperactivity disorder, predominantly inattentive type: Secondary | ICD-10-CM

## 2023-03-10 ENCOUNTER — Other Ambulatory Visit: Payer: Self-pay | Admitting: Family

## 2023-03-10 MED ORDER — LISDEXAMFETAMINE DIMESYLATE 20 MG PO CAPS: 20.0000 mg | ORAL_CAPSULE | Freq: Every day | ORAL | 0 refills | Status: DC

## 2023-04-30 ENCOUNTER — Other Ambulatory Visit: Payer: Self-pay | Admitting: Family

## 2023-04-30 DIAGNOSIS — F900 Attention-deficit hyperactivity disorder, predominantly inattentive type: Secondary | ICD-10-CM

## 2023-05-02 MED ORDER — LISDEXAMFETAMINE DIMESYLATE 20 MG PO CAPS: 20.0000 mg | ORAL_CAPSULE | Freq: Every day | ORAL | 0 refills | Status: DC

## 2023-05-05 ENCOUNTER — Encounter: Payer: Self-pay | Admitting: Family

## 2023-05-06 ENCOUNTER — Other Ambulatory Visit: Payer: Self-pay | Admitting: Family

## 2023-05-06 DIAGNOSIS — Z7187 Encounter for pediatric-to-adult transition counseling: Secondary | ICD-10-CM

## 2023-05-06 NOTE — Progress Notes (Signed)
el

## 2023-09-30 ENCOUNTER — Ambulatory Visit (INDEPENDENT_AMBULATORY_CARE_PROVIDER_SITE_OTHER): Payer: Medicaid Other | Admitting: Nurse Practitioner

## 2023-09-30 ENCOUNTER — Encounter: Payer: Self-pay | Admitting: Nurse Practitioner

## 2023-09-30 VITALS — BP 104/80 | HR 73 | Resp 16 | Ht 60.5 in | Wt 94.2 lb

## 2023-09-30 DIAGNOSIS — Z23 Encounter for immunization: Secondary | ICD-10-CM

## 2023-09-30 DIAGNOSIS — F419 Anxiety disorder, unspecified: Secondary | ICD-10-CM | POA: Diagnosis not present

## 2023-09-30 DIAGNOSIS — R0981 Nasal congestion: Secondary | ICD-10-CM | POA: Diagnosis not present

## 2023-09-30 DIAGNOSIS — F900 Attention-deficit hyperactivity disorder, predominantly inattentive type: Secondary | ICD-10-CM | POA: Diagnosis not present

## 2023-09-30 DIAGNOSIS — F32A Depression, unspecified: Secondary | ICD-10-CM

## 2023-09-30 DIAGNOSIS — Z3009 Encounter for other general counseling and advice on contraception: Secondary | ICD-10-CM | POA: Diagnosis not present

## 2023-09-30 MED ORDER — NORGESTIMATE-ETH ESTRADIOL 0.25-35 MG-MCG PO TABS: 1.0000 | ORAL_TABLET | Freq: Every day | ORAL | 1 refills | Status: AC

## 2023-09-30 MED ORDER — LISDEXAMFETAMINE DIMESYLATE 20 MG PO CAPS: 20.0000 mg | ORAL_CAPSULE | Freq: Every day | ORAL | 0 refills | Status: AC

## 2023-09-30 MED ORDER — CETIRIZINE HCL 10 MG PO TABS: 10.0000 mg | ORAL_TABLET | Freq: Every day | ORAL | 3 refills | Status: AC

## 2023-09-30 MED ORDER — DULOXETINE HCL 20 MG PO CPEP: 20.0000 mg | ORAL_CAPSULE | Freq: Every day | ORAL | 0 refills | Status: AC

## 2023-09-30 NOTE — Patient Instructions (Addendum)
1. Congestion of nasal sinus  - cetirizine (ZYRTEC) 10 MG tablet; Take 1 tablet (10 mg total) by mouth daily.  Dispense: 30 tablet; Refill: 3  2. ADHD, predominantly inattentive type  - lisdexamfetamine (VYVANSE) 20 MG capsule; Take 1 capsule (20 mg total) by mouth daily.  Dispense: 30 capsule; Refill: 0 - Ambulatory referral to Psychiatry  3. Anxiety and depression  - DULoxetine (CYMBALTA) 20 MG capsule; Take 1 capsule (20 mg total) by mouth daily.  Dispense: 90 capsule; Refill: 0 - Ambulatory referral to Psychiatry  4. Need for influenza vaccination  - Flu vaccine trivalent PF, 6mos and older(Flulaval,Afluria,Fluarix,Fluzone)  5. Birth control counseling  - norgestimate-ethinyl estradiol (SPRINTEC 28) 0.25-35 MG-MCG tablet; Take 1 tablet by mouth daily.  Dispense: 112 tablet; Refill: 1    Follow up:  Follow up in 12 months - physical

## 2023-09-30 NOTE — Progress Notes (Signed)
Subjective   Patient ID: Colleen Gordon, female    DOB: Apr 18, 2001, 22 y.o.   MRN: [id number]  Chief Complaint  Patient presents with   Establish Care    Nexplanon expired gyno appt at end of month, irregular period, med refills needed.     Referring provider: No ref. provider found  Colleen Gordon is a 22 y.o. female with Past Medical History: No date: Allergy No date: Headache(784.0) 08/2004: Pneumonia     Comment:  RLL bronchopneumonia   HPI  Patient presents today to establish care.  She has aged out of her pediatric office.  She does have Nexplanon in place and would like to have this removed.  She does already have a OB/GYN appointment scheduled for this.  She does have a history of PCOS that she will be seeing them for as well.  Patient was placed on birth control by her pediatrician because Nexplanon had expired.  She does need a refill on this today.  Patient does have a history of anxiety ADHD.  She does need a refill on her medications today.  We discussed that we will refill these medications and we will place a referral for psychiatry for medication management.  We discussed that Vyvanse is a controlled substance and we do not prescribe this medicine in our office.  We will refill this until she can get an appointment with psychiatry.  Patient will return in the near future to get a complete physical. Denies f/c/s, n/v/d, hemoptysis, PND, leg swelling Denies chest pain or edema    No Known Allergies  Immunization History  Administered Date(s) Administered   DTaP 08/14/2001, 10/17/2001, 01/01/2002, 10/12/2002, 06/07/2006   HIB (PRP-OMP) 08/14/2001, 10/17/2001, 01/01/2002, 10/12/2002   HPV 9-valent 10/03/2020, 12/12/2020, 07/10/2021   Hepatitis A 06/06/2007, 09/13/2008   Hepatitis B 10-31-01, 08/14/2001, 04/04/2002   IPV 08/14/2001, 10/17/2001, 04/04/2002, 06/07/2006   Influenza Nasal 10/14/2009, 09/28/2011, 10/19/2012   Influenza,Quad,Nasal, Live 10/17/2013, 10/29/2014    Influenza,inj,Quad PF,6+ Mos 08/14/2018, 08/16/2019, 07/30/2020   MMR 07/12/2002, 06/07/2006   Meningococcal B, OMV 08/16/2019, 10/03/2020   Meningococcal Conjugate 06/03/2014, 08/10/2017   Pneumococcal Conjugate-13 08/14/2001, 10/17/2001, 04/04/2002, 10/12/2002   Tdap 11/15/2011   Varicella 07/12/2002, 06/07/2006    Tobacco History: Social History   Tobacco Use  Smoking Status Never   Passive exposure: Yes  Smokeless Tobacco Never   Counseling given: Not Answered   Outpatient Encounter Medications as of 09/30/2023  Medication Sig   fluticasone (FLONASE) 50 MCG/ACT nasal spray Place 2 sprays into both nostrils daily.   [DISCONTINUED] cetirizine (ZYRTEC) 10 MG tablet Take 1 tablet (10 mg total) by mouth daily.   [DISCONTINUED] DULoxetine (CYMBALTA) 20 MG capsule TAKE 1 CAPSULE(20 MG) BY MOUTH DAILY   [DISCONTINUED] lisdexamfetamine (VYVANSE) 20 MG capsule Take 1 capsule (20 mg total) by mouth daily.   cetirizine (ZYRTEC) 10 MG tablet Take 1 tablet (10 mg total) by mouth daily.   DULoxetine (CYMBALTA) 20 MG capsule Take 1 capsule (20 mg total) by mouth daily.   lisdexamfetamine (VYVANSE) 20 MG capsule Take 1 capsule (20 mg total) by mouth daily.   norgestimate-ethinyl estradiol (SPRINTEC 28) 0.25-35 MG-MCG tablet Take 1 tablet by mouth daily.   Vitamin D, Ergocalciferol, (DRISDOL) 1.25 MG (50000 UNIT) CAPS capsule Take 1 capsule (50,000 Units total) by mouth every 7 (seven) days. (Patient not taking: Reported on 09/30/2023)   [DISCONTINUED] Clindamycin-Benzoyl Per, Refr, gel Apply 1 application topically every morning. (Patient not taking: Reported on 10/21/2022)   [DISCONTINUED] SPRINTEC 28  0.25-35 MG-MCG tablet TAKE 1 TABLET DAILY. DISCARD PLACEBOS AND TAKE ACTIVE PILLS FOR CONTINUOUS CYCLING. (Patient not taking: Reported on 09/30/2023)   No facility-administered encounter medications on file as of 09/30/2023.    Review of Systems  Review of Systems  Constitutional: Negative.    HENT: Negative.    Cardiovascular: Negative.   Gastrointestinal: Negative.   Allergic/Immunologic: Negative.   Neurological: Negative.   Psychiatric/Behavioral: Negative.       Objective:   BP 104/80   Pulse 73   Resp 16   Ht 5' 0.5" (1.537 m)   Wt 94 lb 3.2 oz (42.7 kg)   SpO2 100%   Breastfeeding No   BMI 18.09 kg/m   Wt Readings from Last 5 Encounters:  09/30/23 94 lb 3.2 oz (42.7 kg)  10/21/22 108 lb (49 kg)  07/20/22 108 lb (49 kg)  06/03/22 110 lb 9.6 oz (50.2 kg)  04/08/22 110 lb 3.2 oz (50 kg)     Physical Exam Vitals and nursing note reviewed.  Constitutional:      General: She is not in acute distress.    Appearance: She is well-developed.  Cardiovascular:     Rate and Rhythm: Normal rate and regular rhythm.  Pulmonary:     Effort: Pulmonary effort is normal.     Breath sounds: Normal breath sounds.  Neurological:     Mental Status: She is alert and oriented to person, place, and time.       Assessment & Plan:   Anxiety and depression -     DULoxetine HCl; Take 1 capsule (20 mg total) by mouth daily.  Dispense: 90 capsule; Refill: 0 -     Ambulatory referral to Psychiatry  Congestion of nasal sinus -     Cetirizine HCl; Take 1 tablet (10 mg total) by mouth daily.  Dispense: 30 tablet; Refill: 3  ADHD, predominantly inattentive type -     Lisdexamfetamine Dimesylate; Take 1 capsule (20 mg total) by mouth daily.  Dispense: 30 capsule; Refill: 0 -     Ambulatory referral to Psychiatry  Need for influenza vaccination -     Flu vaccine trivalent PF, 6mos and older(Flulaval,Afluria,Fluarix,Fluzone)  Birth control counseling -     Norgestimate-Eth Estradiol; Take 1 tablet by mouth daily.  Dispense: 112 tablet; Refill: 1     Return in about 1 year (around 09/29/2024).   Ivonne Andrew, NP 09/30/2023

## 2023-09-30 NOTE — Addendum Note (Signed)
Addended by: Ivonne Andrew on: 09/30/2023 10:17 AM   Modules accepted: Level of Service

## 2023-10-05 ENCOUNTER — Encounter: Payer: Self-pay | Admitting: Nurse Practitioner

## 2023-10-05 ENCOUNTER — Ambulatory Visit (INDEPENDENT_AMBULATORY_CARE_PROVIDER_SITE_OTHER): Payer: Self-pay | Admitting: Nurse Practitioner

## 2023-10-05 VITALS — BP 106/69 | HR 60 | Ht 60.0 in | Wt 92.0 lb

## 2023-10-05 DIAGNOSIS — Z13220 Encounter for screening for lipoid disorders: Secondary | ICD-10-CM

## 2023-10-05 DIAGNOSIS — Z0000 Encounter for general adult medical examination without abnormal findings: Secondary | ICD-10-CM | POA: Diagnosis not present

## 2023-10-05 DIAGNOSIS — E559 Vitamin D deficiency, unspecified: Secondary | ICD-10-CM

## 2023-10-05 NOTE — Progress Notes (Signed)
Subjective   Patient ID: Colleen Gordon, female    DOB: 11-24-2001, 22 y.o.   MRN: [id number]  Chief Complaint  Patient presents with   Annual Exam    Referring provider: Ivonne Andrew, NP  Colleen Gordon is a 22 y.o. female with Past Medical History: No date: Allergy No date: Headache(784.0) 08/2004: Pneumonia     Comment:  RLL bronchopneumonia   HPI  Patient presents today for a physical.  Overall she has been doing well.  She does need to call and get appointment scheduled with psychiatry for medication management.  She does have an OB/GYN appointment at the end of this month.  She does need to get Nexplanon removed.  Past due to be removed. Denies f/c/s, n/v/d, hemoptysis, PND, leg swelling Denies chest pain or edema     No Known Allergies  Immunization History  Administered Date(s) Administered   DTaP 08/14/2001, 10/17/2001, 01/01/2002, 10/12/2002, 06/07/2006   HIB (PRP-OMP) 08/14/2001, 10/17/2001, 01/01/2002, 10/12/2002   HPV 9-valent 10/03/2020, 12/12/2020, 07/10/2021   Hepatitis A 06/06/2007, 09/13/2008   Hepatitis B 02-15-01, 08/14/2001, 04/04/2002   IPV 08/14/2001, 10/17/2001, 04/04/2002, 06/07/2006   Influenza Nasal 10/14/2009, 09/28/2011, 10/19/2012   Influenza, Seasonal, Injecte, Preservative Fre 09/30/2023   Influenza,Quad,Nasal, Live 10/17/2013, 10/29/2014   Influenza,inj,Quad PF,6+ Mos 08/14/2018, 08/16/2019, 07/30/2020   MMR 07/12/2002, 06/07/2006   Meningococcal B, OMV 08/16/2019, 10/03/2020   Meningococcal Conjugate 06/03/2014, 08/10/2017   Pneumococcal Conjugate-13 08/14/2001, 10/17/2001, 04/04/2002, 10/12/2002   Tdap 11/15/2011   Varicella 07/12/2002, 06/07/2006    Tobacco History: Social History   Tobacco Use  Smoking Status Never   Passive exposure: Yes  Smokeless Tobacco Never   Counseling given: Not Answered   Outpatient Encounter Medications as of 10/05/2023  Medication Sig   cetirizine (ZYRTEC) 10 MG tablet Take 1 tablet (10 mg  total) by mouth daily.   DULoxetine (CYMBALTA) 20 MG capsule Take 1 capsule (20 mg total) by mouth daily.   fluticasone (FLONASE) 50 MCG/ACT nasal spray Place 2 sprays into both nostrils daily.   lisdexamfetamine (VYVANSE) 20 MG capsule Take 1 capsule (20 mg total) by mouth daily.   norgestimate-ethinyl estradiol (SPRINTEC 28) 0.25-35 MG-MCG tablet Take 1 tablet by mouth daily.   [DISCONTINUED] Vitamin D, Ergocalciferol, (DRISDOL) 1.25 MG (50000 UNIT) CAPS capsule Take 1 capsule (50,000 Units total) by mouth every 7 (seven) days. (Patient not taking: Reported on 09/30/2023)   No facility-administered encounter medications on file as of 10/05/2023.    Review of Systems  Review of Systems  Constitutional: Negative.   HENT: Negative.    Cardiovascular: Negative.   Gastrointestinal: Negative.   Allergic/Immunologic: Negative.   Neurological: Negative.   Psychiatric/Behavioral: Negative.       Objective:   BP 106/69   Pulse 60   Ht 5' (1.524 m)   Wt 92 lb (41.7 kg)   LMP 10/05/2023   SpO2 100%   BMI 17.97 kg/m   Wt Readings from Last 5 Encounters:  10/05/23 92 lb (41.7 kg)  09/30/23 94 lb 3.2 oz (42.7 kg)  10/21/22 108 lb (49 kg)  07/20/22 108 lb (49 kg)  06/03/22 110 lb 9.6 oz (50.2 kg)     Physical Exam Vitals and nursing note reviewed.  Constitutional:      General: She is not in acute distress.    Appearance: She is well-developed.  Cardiovascular:     Rate and Rhythm: Normal rate and regular rhythm.  Pulmonary:     Effort: Pulmonary effort is normal.  Breath sounds: Normal breath sounds.  Neurological:     Mental Status: She is alert and oriented to person, place, and time.       Assessment & Plan:   Vitamin D deficiency -     VITAMIN D 25 Hydroxy (Vit-D Deficiency, Fractures)  Lipid screening -     Lipid panel  Routine health maintenance -     CBC -     Comprehensive metabolic panel     Return in about 1 year (around 10/04/2024).   Ivonne Andrew, NP 10/05/2023

## 2023-10-05 NOTE — Patient Instructions (Signed)
1. Vitamin D deficiency  - Vitamin D, 25-hydroxy  2. Lipid screening  - Lipid Panel  3. Routine health maintenance  - CBC - Comprehensive metabolic panel  Follow up:  Follow up in 1 year

## 2023-10-06 LAB — CBC
Hematocrit: 42.6 % (ref 34.0–46.6)
Hemoglobin: 14.1 g/dL (ref 11.1–15.9)
MCH: 30.5 pg (ref 26.6–33.0)
MCHC: 33.1 g/dL (ref 31.5–35.7)
MCV: 92 fL (ref 79–97)
Platelets: 227 10*3/uL (ref 150–450)
RBC: 4.63 x10E6/uL (ref 3.77–5.28)
RDW: 11.8 % (ref 11.7–15.4)
WBC: 4.4 10*3/uL (ref 3.4–10.8)

## 2023-10-06 LAB — LIPID PANEL
Chol/HDL Ratio: 2.3 {ratio} (ref 0.0–4.4)
Cholesterol, Total: 166 mg/dL (ref 100–199)
HDL: 71 mg/dL (ref >39)
LDL Chol Calc (NIH): 84 mg/dL (ref 0–99)
Triglycerides: 53 mg/dL (ref 0–149)
VLDL Cholesterol Cal: 11 mg/dL (ref 5–40)

## 2023-10-06 LAB — COMPREHENSIVE METABOLIC PANEL
ALT: 9 [IU]/L (ref 0–32)
AST: 11 [IU]/L (ref 0–40)
Albumin: 4.7 g/dL (ref 4.0–5.0)
Alkaline Phosphatase: 79 [IU]/L (ref 44–121)
BUN/Creatinine Ratio: 17 (ref 9–23)
BUN: 13 mg/dL (ref 6–20)
Bilirubin Total: 1.2 mg/dL (ref 0.0–1.2)
CO2: 21 mmol/L (ref 20–29)
Calcium: 9.3 mg/dL (ref 8.7–10.2)
Chloride: 102 mmol/L (ref 96–106)
Creatinine, Ser: 0.76 mg/dL (ref 0.57–1.00)
Globulin, Total: 2.2 g/dL (ref 1.5–4.5)
Glucose: 90 mg/dL (ref 70–99)
Potassium: 4.1 mmol/L (ref 3.5–5.2)
Sodium: 140 mmol/L (ref 134–144)
Total Protein: 6.9 g/dL (ref 6.0–8.5)
eGFR: 114 mL/min/{1.73_m2} (ref >59)

## 2023-10-06 LAB — VITAMIN D 25 HYDROXY (VIT D DEFICIENCY, FRACTURES): Vit D, 25-Hydroxy: 12.4 ng/mL — ABNORMAL LOW (ref 30.0–100.0)

## 2023-10-21 ENCOUNTER — Encounter: Payer: Self-pay | Admitting: Obstetrics and Gynecology

## 2023-10-21 ENCOUNTER — Other Ambulatory Visit (HOSPITAL_COMMUNITY)
Admission: RE | Admit: 2023-10-21 | Discharge: 2023-10-21 | Disposition: A | Discharge status: Home / Self Care | Payer: Medicaid Other | Source: Ambulatory Visit | Attending: Obstetrics and Gynecology | Admitting: Obstetrics and Gynecology

## 2023-10-21 ENCOUNTER — Ambulatory Visit: Payer: Medicaid Other | Admitting: Obstetrics and Gynecology

## 2023-10-21 VITALS — BP 107/66 | HR 63 | Ht 61.0 in | Wt 95.0 lb

## 2023-10-21 DIAGNOSIS — Z113 Encounter for screening for infections with a predominantly sexual mode of transmission: Secondary | ICD-10-CM

## 2023-10-21 DIAGNOSIS — Z124 Encounter for screening for malignant neoplasm of cervix: Secondary | ICD-10-CM

## 2023-10-21 DIAGNOSIS — Z3046 Encounter for surveillance of implantable subdermal contraceptive: Secondary | ICD-10-CM | POA: Diagnosis not present

## 2023-10-21 DIAGNOSIS — Z01419 Encounter for gynecological examination (general) (routine) without abnormal findings: Secondary | ICD-10-CM

## 2023-10-21 LAB — OB RESULTS CONSOLE GC/CHLAMYDIA: Chlamydia: NEGATIVE

## 2023-10-21 MED ORDER — ETONOGESTREL 68 MG ~~LOC~~ IMPL
68.0000 mg | DRUG_IMPLANT | Freq: Once | SUBCUTANEOUS | Status: AC
Start: 2023-10-21 — End: 2023-10-21
  Administered 2023-10-21: 68 mg via SUBCUTANEOUS

## 2023-10-21 NOTE — Progress Notes (Signed)
ANNUAL EXAM Patient name: Colleen Gordon MRN [id number]  Date of birth: 11/18/2001 Chief Complaint:   Gynecologic Exam and Contraception  History of Present Illness:   Colleen Gordon is a 22 y.o. G0P0 being seen today for a routine annual exam.  Current complaints: nexplanon removal, pap  Menstrual concerns? Yes  irregular cycles Breast or nipple changes? No  Contraception use? Yes  Sexually active? Yes   Discussed the use of AI scribe software for clinical note transcription with the patient, who gave verbal consent to proceed.  History of Present Illness   The patient, with a history of Polycystic Ovary Syndrome (PCOS), presents with concerns regarding her current contraceptive methods and irregular menstrual cycles. She has been using Nexplanon for five years and sporadically taking Sprintec 'as needed' for spotting. However, she reports that her menstrual cycle has been irregular. She is currently sexually active with a female partner and is not seeking pregnancy at this time. She also expresses confusion about the off-label use of Nexplanon for five years. The patient has never had a Pap smear before.      Patient's last menstrual period was 10/05/2023.   The pregnancy intention screening data noted above was reviewed. Potential methods of contraception were discussed. The patient elected to proceed with No data recorded.   Last pap No results found for: "DIAGPAP", "HPVHIGH", "ADEQPAP" Last mammogram: n/a.  Last colonoscopy: n/a.      09/30/2023    9:32 AM 10/22/2022    6:00 PM 03/11/2022    9:22 AM 12/15/2021    2:14 PM 08/25/2021    9:17 AM  Depression screen PHQ 2/9  Decreased Interest 0 0 1 0 0  Down, Depressed, Hopeless 0 0 0 0 0  PHQ - 2 Score 0 0 1 0 0  Altered sleeping  0 1 0 0  Tired, decreased energy  2 1 1 1   Change in appetite  0 1 1 2   Feeling bad or failure about yourself   0 0 0 0  Trouble concentrating  3 1 1 1   Moving slowly or fidgety/restless  0 0 0 0   PHQ-9 Score  5 5 3 4         10/22/2022    6:00 PM 03/11/2022    9:24 AM 03/11/2022    9:22 AM 12/15/2021    2:14 PM  GAD 7 : Generalized Anxiety Score  Nervous, Anxious, on Edge 1 1  1   Control/stop worrying 1 0 0 0  Worry too much - different things 2 1 1 1   Trouble relaxing 1 1 1  0  Restless 1 0 0 0  Easily annoyed or irritable 2 1 1 1   Afraid - awful might happen 0 0 0 0  Total GAD 7 Score 8 4  3      Review of Systems:   Pertinent items are noted in HPI Denies any headaches, blurred vision, fatigue, shortness of breath, chest pain, abdominal pain, abnormal vaginal discharge/itching/odor/irritation, problems with periods, bowel movements, urination, or intercourse unless otherwise stated above. Pertinent History Reviewed:  Reviewed past medical,surgical, social and family history.  Reviewed problem list, medications and allergies. Physical Assessment:   Vitals:   10/21/23 1019  Weight: 95 lb (43.1 kg)  Height: 5\' 1"  (1.549 m)  Body mass index is 17.95 kg/m.        Physical Examination:   General appearance - well appearing, and in no distress  Mental status - alert, oriented to person, place, and time  Psych:  She has a normal mood and affect  Skin - warm and dry, normal color, no suspicious lesions noted  Chest - effort normal, all lung fields clear to auscultation bilaterally  Heart - normal rate and regular rhythm  Breasts - breasts appear normal, no suspicious masses, no skin or nipple changes or  axillary nodes  Abdomen - soft, nontender, nondistended, no masses or organomegaly  Pelvic -  VULVA: normal appearing vulva with no masses, tenderness or lesions   VAGINA: normal appearing vagina with normal color and discharge, no lesions   CERVIX: normal appearing cervix without discharge or lesions, no CMT  Thin prep pap is done with HR HPV cotesting  UTERUS: uterus is felt to be normal size, shape, consistency and nontender   ADNEXA: No adnexal masses or  tenderness noted.  Extremities:  No swelling or varicosities noted  Chaperone present for exam  Nexplanon Removal and Reinsertion Patient identified, informed consent performed, consent signed.   Patient does understand that irregular bleeding is a very common side effect of this medication. She was advised to have backup contraception for one week after replacement of the implant. Pregnancy test in clinic today was negative.  Appropriate time out taken. Nexplanon site identified in left arm.  Area prepped in usual sterile fashon. One ml of 1% lidocaine with epinephrine was used to anesthetize the area at the distal end of the implant. A small stab incision was made right beside the implant on the distal portion. The Nexplanon rod was removed without difficulty. There was minimal blood loss. There were no complications. Area was then injected with 3 ml of 1 % lidocaine with epinephrine. She was re-prepped with betadine, Nexplanon removed from packaging, Device confirmed in needle, then inserted full length of needle and withdrawn per handbook instructions. Nexplanon was able to palpated in the patient's arm; patient palpated the insert herself.  There was minimal blood loss. Patient insertion site covered with gauze and a pressure bandage to reduce any bruising. The patient tolerated the procedure well and was given post procedure instructions.  She was advised to have backup contraception for one week.     Assessment & Plan:  Assessment and Plan    Polycystic Ovary Syndrome (PCOS) and Contraception   She has been inconsistently using Nexplanon and Sprintec. We discussed the benefits of regular Sprintec use for both contraception and menstrual regulation, noting that while Nexplanon is effective for contraception, it may not aid in menstrual regulation. We advise regular daily use of Sprintec for menstrual regulation and contraception or can continue with Nexplanon.  Patient elects for replacement  Nexplanon with prn Sprintec for spotting/irregular bleeding.  Cervical Cancer Screening   She has never had a Pap smear. We explained the purpose and frequency of Pap smears and copmleted Pap smear as part of her annual examination.  Nexplanon Removal   She has had Nexplanon for five years and was informed about its off-label use beyond three years. She is now s/p uncomplicated removal and insertion.       No orders of the defined types were placed in this encounter.   Meds: No orders of the defined types were placed in this encounter.   Follow-up: No follow-ups on file.  Lorriane Shire, MD 10/21/2023 10:26 AM

## 2023-10-26 LAB — CYTOLOGY - PAP
Adequacy: ABSENT
Chlamydia: NEGATIVE
Comment: NEGATIVE
Comment: NEGATIVE
Comment: NORMAL
Diagnosis: NEGATIVE
Neisseria Gonorrhea: NEGATIVE
Trichomonas: NEGATIVE

## 2023-11-04 ENCOUNTER — Other Ambulatory Visit: Payer: Self-pay | Admitting: Medical Genetics

## 2023-11-04 ENCOUNTER — Telehealth: Payer: Medicaid Other | Admitting: Physician Assistant

## 2023-11-04 ENCOUNTER — Ambulatory Visit
Admission: EM | Admit: 2023-11-04 | Discharge: 2023-11-04 | Disposition: A | Discharge status: Home / Self Care | Payer: Medicaid Other | Attending: Internal Medicine | Admitting: Internal Medicine

## 2023-11-04 DIAGNOSIS — R300 Dysuria: Secondary | ICD-10-CM

## 2023-11-04 DIAGNOSIS — Z3202 Encounter for pregnancy test, result negative: Secondary | ICD-10-CM | POA: Insufficient documentation

## 2023-11-04 DIAGNOSIS — R350 Frequency of micturition: Secondary | ICD-10-CM | POA: Insufficient documentation

## 2023-11-04 DIAGNOSIS — N3001 Acute cystitis with hematuria: Secondary | ICD-10-CM | POA: Insufficient documentation

## 2023-11-04 DIAGNOSIS — Z006 Encounter for examination for normal comparison and control in clinical research program: Secondary | ICD-10-CM

## 2023-11-04 DIAGNOSIS — N898 Other specified noninflammatory disorders of vagina: Secondary | ICD-10-CM

## 2023-11-04 LAB — POCT URINALYSIS DIP (MANUAL ENTRY)
Bilirubin, UA: NEGATIVE
Glucose, UA: NEGATIVE mg/dL
Ketones, POC UA: NEGATIVE mg/dL
Nitrite, UA: NEGATIVE
Protein Ur, POC: 30 mg/dL — AB
Spec Grav, UA: 1.02 (ref 1.010–1.025)
Urobilinogen, UA: 0.2 U/dL
pH, UA: 6.5 (ref 5.0–8.0)

## 2023-11-04 LAB — POCT URINE PREGNANCY: Preg Test, Ur: NEGATIVE

## 2023-11-04 MED ORDER — CEFTRIAXONE SODIUM 1 G IJ SOLR
1.0000 g | Freq: Once | INTRAMUSCULAR | Status: AC
Start: 2023-11-04 — End: 2023-11-04
  Administered 2023-11-04: 1 g via INTRAMUSCULAR

## 2023-11-04 MED ORDER — SULFAMETHOXAZOLE-TRIMETHOPRIM 800-160 MG PO TABS: 1.0000 | ORAL_TABLET | Freq: Two times a day (BID) | ORAL | 0 refills | Status: AC

## 2023-11-04 NOTE — ED Provider Notes (Signed)
[facility]    CSN: [id number] Arrival date & time: 11/04/23  1518      History   Chief Complaint Chief Complaint  Patient presents with   UTI Symptoms    HPI Colleen Gordon is a 22 y.o. female.   Patient presents with approximately 1 week history of dysuria and urinary frequency.  Reports that a few days ago, she developed bilateral lower back pain that has been fluctuating back-and-forth.  Denies any fever, hematuria, vaginal discharge, abdominal pain, pelvic pain.  Denies any concern for STD but the patient is sexually active.  Last menstrual cycle was 10/05/2023.     Past Medical History:  Diagnosis Date   Allergy    Headache(784.0)    Pneumonia 08/2004   RLL bronchopneumonia    Patient Active Problem List   Diagnosis Date Noted   Nexplanon in place 10/21/2022   Vitamin D deficiency 12/15/2021   Elevated liver function tests 12/15/2021   ADHD, predominantly inattentive type 05/27/2021   Sleep disturbance 11/24/2020   Adjustment disorder with mixed anxiety and depressed mood 11/15/2018   Breakthrough bleeding on Nexplanon 11/15/2018   Acne vulgaris 11/15/2018   PCOS (polycystic ovarian syndrome) 10/31/2018    History reviewed. No pertinent surgical history.  OB History     Gravida  0   Para      Term      Preterm      AB      Living         SAB      IAB      Ectopic      Multiple      Live Births               Home Medications    Prior to Admission medications   Medication Sig Start Date End Date Taking? Authorizing Provider  cetirizine (ZYRTEC) 10 MG tablet Take 1 tablet (10 mg total) by mouth daily. 09/30/23  Yes Ivonne Andrew, NP  DULoxetine (CYMBALTA) 20 MG capsule Take 1 capsule (20 mg total) by mouth daily. 09/30/23  Yes Ivonne Andrew, NP  fluticasone (FLONASE) 50 MCG/ACT nasal spray Place 2 sprays into both nostrils daily. 03/11/22  Yes Verneda Skill, FNP  lisdexamfetamine (VYVANSE) 20 MG capsule Take 1  capsule (20 mg total) by mouth daily. 09/30/23  Yes Ivonne Andrew, NP  sulfamethoxazole-trimethoprim (BACTRIM DS) 800-160 MG tablet Take 1 tablet by mouth 2 (two) times daily for 7 days. 11/04/23 11/11/23 Yes Robi Dewolfe, Acie Fredrickson, FNP  norgestimate-ethinyl estradiol (SPRINTEC 28) 0.25-35 MG-MCG tablet Take 1 tablet by mouth daily. Patient not taking: Reported on 10/21/2023 09/30/23   Ivonne Andrew, NP    Family History Family History  Problem Relation Age of Onset   Glaucoma Mother    AVM Father    COPD Father    Emphysema Father    Cataracts Father    Hyperlipidemia Father    Hypertension Father    Cataracts Sister        18 yrs   Alcohol abuse Neg Hx    Arthritis Neg Hx    Asthma Neg Hx    Birth defects Neg Hx    Cancer Neg Hx    Depression Neg Hx    Diabetes Neg Hx    Drug abuse Neg Hx    Early death Neg Hx    Hearing loss Neg Hx    Heart disease Neg Hx    Kidney disease Neg Hx  Learning disabilities Neg Hx    Mental illness Neg Hx    Mental retardation Neg Hx    Miscarriages / Stillbirths Neg Hx    Stroke Neg Hx    Vision loss Neg Hx    Varicose Veins Neg Hx     Social History Social History   Tobacco Use   Smoking status: Never    Passive exposure: Yes   Smokeless tobacco: Never  Vaping Use   Vaping status: Never Used  Substance Use Topics   Alcohol use: No   Drug use: No     Allergies   Patient has no known allergies.   Review of Systems Review of Systems Per HPI  Physical Exam Triage Vital Signs ED Triage Vitals  Encounter Vitals Group     BP 11/04/23 1527 99/65     Systolic BP Percentile --      Diastolic BP Percentile --      Pulse Rate 11/04/23 1527 100     Resp 11/04/23 1527 18     Temp 11/04/23 1527 99.1 F (37.3 C)     Temp Source 11/04/23 1527 Oral     SpO2 11/04/23 1527 97 %     Weight 11/04/23 1526 94 lb (42.6 kg)     Height 11/04/23 1526 5\' 1"  (1.549 m)     Head Circumference --      Peak Flow --      Pain Score 11/04/23  1523 5     Pain Loc --      Pain Education --      Exclude from Growth Chart --    No data found.  Updated Vital Signs BP 99/65 (BP Location: Left Arm)   Pulse 100   Temp 99.1 F (37.3 C) (Oral)   Resp 18   Ht 5\' 1"  (1.549 m)   Wt 94 lb (42.6 kg)   LMP 10/05/2023   SpO2 97%   BMI 17.76 kg/m   Visual Acuity Right Eye Distance:   Left Eye Distance:   Bilateral Distance:    Right Eye Near:   Left Eye Near:    Bilateral Near:     Physical Exam Constitutional:      General: She is not in acute distress.    Appearance: Normal appearance. She is not toxic-appearing or diaphoretic.  HENT:     Head: Normocephalic and atraumatic.  Eyes:     Extraocular Movements: Extraocular movements intact.     Conjunctiva/sclera: Conjunctivae normal.  Pulmonary:     Effort: Pulmonary effort is normal.  Neurological:     General: No focal deficit present.     Mental Status: She is alert and oriented to person, place, and time. Mental status is at baseline.  Psychiatric:        Mood and Affect: Mood normal.        Behavior: Behavior normal.        Thought Content: Thought content normal.        Judgment: Judgment normal.      UC Treatments / Results  Labs (all labs ordered are listed, but only abnormal results are displayed) Labs Reviewed  POCT URINALYSIS DIP (MANUAL ENTRY) - Abnormal; Notable for the following components:      Result Value   Clarity, UA cloudy (*)    Blood, UA small (*)    Protein Ur, POC =30 (*)    Leukocytes, UA Large (3+) (*)    All other components within normal limits  POCT  URINE PREGNANCY - Normal  URINE CULTURE    EKG   Radiology No results found.  Procedures Procedures (including critical care time)  Medications Ordered in UC Medications  cefTRIAXone (ROCEPHIN) injection 1 g (has no administration in time range)    Initial Impression / Assessment and Plan / UC Course  I have reviewed the triage vital signs and the nursing  notes.  Pertinent labs & imaging results that were available during my care of the patient were reviewed by me and considered in my medical decision making (see chart for details).     UA indicating urinary tract infection.  Will treat with IM Rocephin and Bactrim.  Urine culture pending.  Patient's vital signs are normal with no fever so do not think that emergent evaluation is necessary.  Urine pregnancy test negative.  Advised adequate fluids and supportive care.  Advised strict follow-up precautions.  Patient verbalized understanding and was agreeable with plan. Final Clinical Impressions(s) / UC Diagnoses   Final diagnoses:  Acute cystitis with hematuria  Dysuria  Urinary frequency  Urine pregnancy test negative     Discharge Instructions      It appears that you have a UTI so I have sent an antibiotic to treat this.  You were also given antibiotic injection today in urgent care.  Urine culture is pending.  Will call if it is positive.  Your pregnancy test was negative.     ED Prescriptions     Medication Sig Dispense Auth. Provider   sulfamethoxazole-trimethoprim (BACTRIM DS) 800-160 MG tablet Take 1 tablet by mouth 2 (two) times daily for 7 days. 14 tablet [place], Acie Fredrickson, [place]      PDMP not reviewed this encounter.   Gustavus Bryant, [place] 11/04/23 [phone]

## 2023-11-04 NOTE — Discharge Instructions (Signed)
It appears that you have a UTI so I have sent an antibiotic to treat this.  You were also given antibiotic injection today in urgent care.  Urine culture is pending.  Will call if it is positive.  Your pregnancy test was negative.

## 2023-11-04 NOTE — ED Triage Notes (Signed)
"  I am experiencing UTI symptoms, a few nights ago also started having right flank pain and now left at times (sharp)" Symptoms include: dysuria, urinary urgency and frequency. No concern for STI.

## 2023-11-04 NOTE — Progress Notes (Signed)
Because you are having kidney pain with painful urination and vaginal discharge, I feel your condition warrants further evaluation and I recommend that you be seen in a face to face visit. It is possible you may have a more severe, complicated UTI called Pyelonephritis (Kidney infection). These are best managed in person as they can become severe quickly and can lead to hospitalization.   NOTE: There will be NO CHARGE for this eVisit   If you are having a true medical emergency please call 911.      For an urgent face to face visit, [facility] has eight urgent care centers for your convenience:   NEW!! [facility] Health Urgent Care Center at [facility] Get Driving Directions [phone] [address] [place], [address]    [facility] Urgent Care Center at [facility] Get Driving Directions [phone] [address] [place], [place] [address]   [facility] Health Urgent Care Center [facility]) Get Driving Directions [phone] [address] [place], [place] [address]  [facility] Health Urgent Care Center [facility]) Get Driving Directions [phone] [address] [place],  [place]  [address]  [facility] Health Urgent Care Center [facility] - at [employer]  [phone] [address] [place],  [place] [address]   [facility] Health Urgent Care at [facility] Get Driving Directions [phone] [address] [place], [place] [address]   [facility] Health Urgent Care at [facility] Get Driving Directions  [phone] [address] [place], [place] [address]   [facility] Health Urgent Care at [facility] Directions [phone] [address] [place], [place] [address]  Your MyChart E-visit questionnaire answers were reviewed by a board certified advanced clinical practitioner to complete your personal care plan based on your specific  symptoms.  Thank you for using e-Visits.    I have spent 5 minutes in review of e-visit questionnaire, review and updating patient chart, medical decision making and response to patient.   [name], PA-C

## 2023-11-07 LAB — URINE CULTURE: Culture: >=100000 — AB

## 2023-11-07 NOTE — Telephone Encounter (Signed)
Pt went to [facility] 11/04/23. [facility]

## 2023-12-04 ENCOUNTER — Encounter: Payer: Self-pay | Admitting: Obstetrics and Gynecology

## 2023-12-05 ENCOUNTER — Other Ambulatory Visit: Payer: Self-pay | Admitting: Obstetrics and Gynecology

## 2023-12-05 ENCOUNTER — Telehealth: Payer: Self-pay

## 2023-12-05 DIAGNOSIS — N921 Excessive and frequent menstruation with irregular cycle: Secondary | ICD-10-CM

## 2023-12-05 MED ORDER — CELECOXIB 200 MG PO CAPS: 200.0000 mg | ORAL_CAPSULE | Freq: Every day | ORAL | 1 refills | Status: DC

## 2023-12-05 NOTE — Telephone Encounter (Signed)
Called patient to schedule [place] and follow up appointment per Dr. Briscoe Deutscher. Was unable to leave a voicemail due to the box being full. Sending the patient a MyChart message now.

## 2023-12-07 ENCOUNTER — Ambulatory Visit (HOSPITAL_BASED_OUTPATIENT_CLINIC_OR_DEPARTMENT_OTHER)
Admission: RE | Admit: 2023-12-07 | Discharge: 2023-12-07 | Disposition: A | Discharge status: Home / Self Care | Payer: Medicaid Other | Source: Ambulatory Visit | Attending: Obstetrics and Gynecology | Admitting: Obstetrics and Gynecology

## 2023-12-07 DIAGNOSIS — N926 Irregular menstruation, unspecified: Secondary | ICD-10-CM | POA: Diagnosis not present

## 2023-12-07 DIAGNOSIS — Z975 Presence of (intrauterine) contraceptive device: Secondary | ICD-10-CM | POA: Insufficient documentation

## 2023-12-07 DIAGNOSIS — N921 Excessive and frequent menstruation with irregular cycle: Secondary | ICD-10-CM | POA: Diagnosis not present

## 2023-12-10 ENCOUNTER — Encounter: Payer: Self-pay | Admitting: Obstetrics and Gynecology

## 2023-12-12 ENCOUNTER — Other Ambulatory Visit (HOSPITAL_COMMUNITY): Payer: Medicaid Other

## 2023-12-27 ENCOUNTER — Other Ambulatory Visit (HOSPITAL_COMMUNITY): Payer: Medicaid Other

## 2024-01-09 DIAGNOSIS — Z79899 Other long term (current) drug therapy: Secondary | ICD-10-CM | POA: Diagnosis not present

## 2024-01-16 ENCOUNTER — Other Ambulatory Visit (HOSPITAL_COMMUNITY): Payer: Self-pay

## 2024-01-18 ENCOUNTER — Telehealth: Payer: Medicaid Other | Admitting: Physician Assistant

## 2024-01-18 DIAGNOSIS — J208 Acute bronchitis due to other specified organisms: Secondary | ICD-10-CM | POA: Diagnosis not present

## 2024-01-18 DIAGNOSIS — B9689 Other specified bacterial agents as the cause of diseases classified elsewhere: Secondary | ICD-10-CM | POA: Diagnosis not present

## 2024-01-18 MED ORDER — AZITHROMYCIN 250 MG PO TABS: ORAL_TABLET | ORAL | 0 refills | Status: AC

## 2024-01-18 MED ORDER — PSEUDOEPH-BROMPHEN-DM 30-2-10 MG/5ML PO SYRP: 5.0000 mL | ORAL_SOLUTION | Freq: Four times a day (QID) | ORAL | 0 refills | Status: AC | PRN

## 2024-01-18 NOTE — Progress Notes (Signed)
Virtual Visit Consent   Colleen Gordon, you are scheduled for a virtual visit with a [facility] provider today. Just as with appointments in the office, your consent must be obtained to participate. Your consent will be active for this visit and any virtual visit you may have with one of our providers in the next 365 days. If you have a MyChart account, a copy of this consent can be sent to you electronically.  As this is a virtual visit, video technology does not allow for your provider to perform a traditional examination. This may limit your provider's ability to fully assess your condition. If your provider identifies any concerns that need to be evaluated in person or the need to arrange testing (such as labs, EKG, etc.), we will make arrangements to do so. Although advances in technology are sophisticated, we cannot ensure that it will always work on either your end or our end. If the connection with a video visit is poor, the visit may have to be switched to a telephone visit. With either a video or telephone visit, we are not always able to ensure that we have a secure connection.  By engaging in this virtual visit, you consent to the provision of healthcare and authorize for your insurance to be billed (if applicable) for the services provided during this visit. Depending on your insurance coverage, you may receive a charge related to this service.  I need to obtain your verbal consent now. Are you willing to proceed with your visit today? Colleen Gordon has provided verbal consent on 01/18/2024 for a virtual visit (video or telephone). [name], PA-C  Date: 01/18/2024 3:19 PM  Virtual Visit via Video Note   IMargaretann Loveless, connected with  Colleen Gordon  ([id number], May 08, 2001) on 01/18/24 at  3:15 PM EST by a video-enabled telemedicine application and verified that I am speaking with the correct person using two identifiers.  Location: Patient: Virtual Visit Location Patient:  Home Provider: Virtual Visit Location Provider: Home Office   I discussed the limitations of evaluation and management by telemedicine and the availability of in person appointments. The patient expressed understanding and agreed to proceed.    History of Present Illness: Colleen Gordon is a 23 y.o. who identifies as a female who was assigned female at birth, and is being seen today for flu-like symptoms.  HPI: URI  This is a new problem. The current episode started in the past 7 days. The problem has been gradually worsening. Maximum temperature: subjective fevers. Associated symptoms include congestion, coughing (dry), headaches and a sore throat. Pertinent negatives include no chest pain, diarrhea, ear pain, nausea, plugged ear sensation, rhinorrhea, sinus pain, vomiting or wheezing. Associated symptoms comments: Fatigue, hot flashes, chills, muscle aches, mild nasal congestion. Treatments tried: tylenol, ibuprofen. The treatment provided no relief.  Covid 19 and Flu testing at home both negative   Problems:  Patient Active Problem List   Diagnosis Date Noted   Nexplanon in place 10/21/2022   Vitamin D deficiency 12/15/2021   Elevated liver function tests 12/15/2021   ADHD, predominantly inattentive type 05/27/2021   Sleep disturbance 11/24/2020   Adjustment disorder with mixed anxiety and depressed mood 11/15/2018   Breakthrough bleeding on Nexplanon 11/15/2018   Acne vulgaris 11/15/2018   PCOS (polycystic ovarian syndrome) 10/31/2018    Allergies: No Known Allergies Medications:  Current Outpatient Medications:    azithromycin (ZITHROMAX) 250 MG tablet, Take 2 tablets on day 1, then 1 tablet daily on  days 2 through 5, Disp: 6 tablet, Rfl: 0   brompheniramine-pseudoephedrine-DM 30-2-10 MG/5ML syrup, Take 5 mLs by mouth 4 (four) times daily as needed., Disp: 120 mL, Rfl: 0   cetirizine (ZYRTEC) 10 MG tablet, Take 1 tablet (10 mg total) by mouth daily., Disp: 30 tablet, Rfl: 3    DULoxetine (CYMBALTA) 20 MG capsule, Take 1 capsule (20 mg total) by mouth daily., Disp: 90 capsule, Rfl: 0   fluticasone (FLONASE) 50 MCG/ACT nasal spray, Place 2 sprays into both nostrils daily., Disp: 16 g, Rfl: 4   lisdexamfetamine (VYVANSE) 20 MG capsule, Take 1 capsule (20 mg total) by mouth daily., Disp: 30 capsule, Rfl: 0   norgestimate-ethinyl estradiol (SPRINTEC 28) 0.25-35 MG-MCG tablet, Take 1 tablet by mouth daily. (Patient not taking: Reported on 10/21/2023), Disp: 112 tablet, Rfl: 1  Observations/Objective: Patient is well-developed, well-nourished in no acute distress.  Resting comfortably at home.  Head is normocephalic, atraumatic.  No labored breathing.  Speech is clear and coherent with logical content.  Patient is alert and oriented at baseline.    Assessment and Plan: 1. Acute bacterial bronchitis (Primary) - azithromycin (ZITHROMAX) 250 MG tablet; Take 2 tablets on day 1, then 1 tablet daily on days 2 through 5  Dispense: 6 tablet; Refill: 0 - brompheniramine-pseudoephedrine-DM 30-2-10 MG/5ML syrup; Take 5 mLs by mouth 4 (four) times daily as needed.  Dispense: 120 mL; Refill: 0  - Worsening over a week despite OTC medications - Will treat with Z-pack and Bromfed DM - Can continue Mucinex  - Push fluids.  - Rest.  - Steam and humidifier can help - Seek in person evaluation if worsening or symptoms fail to improve    Follow Up Instructions: I discussed the assessment and treatment plan with the patient. The patient was provided an opportunity to ask questions and all were answered. The patient agreed with the plan and demonstrated an understanding of the instructions.  A copy of instructions were sent to the patient via MyChart unless otherwise noted below.    The patient was advised to call back or seek an in-person evaluation if the symptoms worsen or if the condition fails to improve as anticipated.    [name], PA-C

## 2024-01-18 NOTE — Patient Instructions (Signed)
[name], thank you for joining [name], PA-C for today's virtual visit.  While this provider is not your primary care provider (PCP), if your PCP is located in our provider database this encounter information will be shared with them immediately following your visit.   A [facility] MyChart account gives you access to today's visit and all your visits, tests, and labs performed at [facility] " click here if you don't have a [facility] MyChart account or go to [email or web address]  Consent: (Patient) Colleen Gordon provided verbal consent for this virtual visit at the beginning of the encounter.  Current Medications:  Current Outpatient Medications:    azithromycin (ZITHROMAX) 250 MG tablet, Take 2 tablets on day 1, then 1 tablet daily on days 2 through 5, Disp: 6 tablet, Rfl: 0   brompheniramine-pseudoephedrine-DM 30-2-10 MG/5ML syrup, Take 5 mLs by mouth 4 (four) times daily as needed., Disp: 120 mL, Rfl: 0   cetirizine (ZYRTEC) 10 MG tablet, Take 1 tablet (10 mg total) by mouth daily., Disp: 30 tablet, Rfl: 3   DULoxetine (CYMBALTA) 20 MG capsule, Take 1 capsule (20 mg total) by mouth daily., Disp: 90 capsule, Rfl: 0   fluticasone (FLONASE) 50 MCG/ACT nasal spray, Place 2 sprays into both nostrils daily., Disp: 16 g, Rfl: 4   lisdexamfetamine (VYVANSE) 20 MG capsule, Take 1 capsule (20 mg total) by mouth daily., Disp: 30 capsule, Rfl: 0   norgestimate-ethinyl estradiol (SPRINTEC 28) 0.25-35 MG-MCG tablet, Take 1 tablet by mouth daily. (Patient not taking: Reported on 10/21/2023), Disp: 112 tablet, Rfl: 1   Medications ordered in this encounter:  Meds ordered this encounter  Medications   azithromycin (ZITHROMAX) 250 MG tablet    Sig: Take 2 tablets on day 1, then 1 tablet daily on days 2 through 5    Dispense:  6 tablet    Refill:  0    Supervising Provider:   Merrilee Jansky [[id number]]   brompheniramine-pseudoephedrine-DM 30-2-10 MG/5ML syrup    Sig:  Take 5 mLs by mouth 4 (four) times daily as needed.    Dispense:  120 mL    Refill:  0    Supervising Provider:   Merrilee Jansky [[id number]]     *If you need refills on other medications prior to your next appointment, please contact your pharmacy*  Follow-Up: Call back or seek an in-person evaluation if the symptoms worsen or if the condition fails to improve as anticipated.   Virtual Care [phone]  Other Instructions Community-Acquired Pneumonia, Adult Pneumonia is a lung infection that causes inflammation and the buildup of mucus and fluids in the lungs. This may cause coughing and difficulty breathing. Community-acquired pneumonia is pneumonia that develops in people who are not, and have not recently been, in a hospital or other health care facility. Usually, pneumonia develops as a result of an illness that is caused by a virus, such as the common cold and the flu (influenza). It can also be caused by bacteria or fungi. While the common cold and influenza can pass from person to person (are contagious), pneumonia itself is not considered contagious. What are the causes? This condition may be caused by: Viruses. Bacteria. Fungi. What increases the risk? The following factors may make you more likely to develop this condition: Being over age 83 or having certain medical conditions, such as: A long-term (chronic) disease, such as: chronic obstructive pulmonary disease (COPD), asthma, heart failure, diabetes, or kidney disease. A condition that increases the  risk of breathing in (aspirating) mucus and other fluids from your mouth and nose. A weakened body defense system (immune system). Having had your spleen removed (splenectomy). The spleen is the organ that helps fight germs and infections. Not cleaning your teeth and gums well (poor dental hygiene). Using tobacco products. Traveling to places where germs that cause pneumonia are present or being near certain  animals or animal habitats that could have germs that cause pneumonia. What are the signs or symptoms? Symptoms of this condition include: A dry cough or a wet (productive) cough. A fever, sweating, or chills. Chest pain, especially when breathing deeply or coughing. Fast breathing, difficulty breathing, or shortness of breath. Tiredness (fatigue) and muscle aches. How is this diagnosed? This condition may be diagnosed based on your medical history or a physical exam. You may also have tests, including: Imaging, such as a chest X-ray or lung ultrasound. Tests of: The level of oxygen and other gases in your blood. Mucus from your lungs (sputum). Fluid around your lungs (pleural fluid). Your urine. How is this treated? Treatment for this condition depends on many factors, such as the cause of your pneumonia, your medicines, and other medical conditions that you have. For most adults, pneumonia may be treated at home. In some cases, treatment must happen in a hospital and may include: Medicines that are given by mouth (orally) or through an IV, including: Antibiotic medicines, if bacteria caused the pneumonia. Medicines that kill viruses (antiviral medicines), if a virus caused the pneumonia. Oxygen therapy. Severe pneumonia, although rare, may require the following treatments: Mechanical ventilation.This procedure uses a machine to help you breathe if you cannot breathe well on your own or maintain a safe level of blood oxygen. Thoracentesis. This procedure removes any buildup of pleural fluid to help with breathing. Follow these instructions at home:  Medicines Take over-the-counter and prescription medicines only as told by your health care provider. Take cough medicine only if you have trouble sleeping. Cough medicine can prevent your body from removing mucus from your lungs. If you were prescribed antibiotics, take them as told by your health care provider. Do not stop taking the  antibiotic even if you start to feel better. Lifestyle     Do not drink alcohol. Do not use any products that contain nicotine or tobacco. These products include cigarettes, chewing tobacco, and vaping devices, such as e-cigarettes. If you need help quitting, ask your health care provider. Eat a healthy diet. This includes plenty of vegetables, fruits, whole grains, low-fat dairy products, and lean protein. General instructions Rest a lot and get at least 8 hours of sleep each night. Sleep in a partly upright position at night. Place a few pillows under your head or sleep in a reclining chair. Return to your normal activities as told by your health care provider. Ask your health care provider what activities are safe for you. Drink enough fluid to keep your urine pale yellow. This helps to thin the mucus in your lungs. If your throat is sore, gargle with a mixture of salt and water 3-4 times a day or as needed. To make salt water, completely dissolve -1 tsp (3-6 g) of salt in 1 cup (237 mL) of warm water. Keep all follow-up visits. How is this prevented? You can lower your risk of developing community-acquired pneumonia by: Getting the pneumonia vaccine. There are different types and schedules of pneumonia vaccines. Ask your health care provider which option is best for you. Consider getting the  pneumonia vaccine if: You are older than 23 years of age. You are 10-54 years of age and are receiving cancer treatment, have chronic lung disease, or have other medical conditions that affect your immune system. Ask your health care provider if this applies to you. Getting your influenza vaccine every year. Ask your health care provider which type of vaccine is best for you. Getting regular dental checkups. Washing your hands often with soap and water for at least 20 seconds. If soap and water are not available, use hand sanitizer. Contact a health care provider if: You have a fever. You have  trouble sleeping because you cannot control your cough with cough medicine. Get help right away if: Your shortness of breath becomes worse. Your chest pain increases. Your sickness becomes worse, especially if you are an older adult or have a weak immune system. You cough up blood. These symptoms may be an emergency. Get help right away. Call 911. Do not wait to see if the symptoms will go away. Do not drive yourself to the hospital. Summary Pneumonia is an infection of the lungs. Community-acquired pneumonia develops in people who have not been in the hospital. It can be caused by bacteria, viruses, or fungi. This condition may be treated with antibiotics or antiviral medicines. Severe pneumonia may require a hospital stay and treatment to help with breathing. This information is not intended to replace advice given to you by your health care provider. Make sure you discuss any questions you have with your health care provider. Document Revised: 09/08/2023 Document Reviewed: 02/10/2022 Elsevier Patient Education  2024 Elsevier Inc.   If you have been instructed to have an in-person evaluation today at a local Urgent Care facility, please use the link below. It will take you to a list of all of our available [facility], including address, phone number and hours of operation. Please do not delay care.  [facility]  If you or a family member do not have a primary care provider, use the link below to schedule a visit and establish care. When you choose a [facility] primary care physician or advanced practice provider, you gain a long-term partner in health. Find a Primary Care Provider  Learn more about [facility]'s in-office and virtual care options:  - Get Care Now

## 2024-01-23 ENCOUNTER — Ambulatory Visit: Payer: Medicaid Other | Admitting: Obstetrics and Gynecology

## 2024-02-10 ENCOUNTER — Ambulatory Visit: Payer: Medicaid Other | Admitting: Obstetrics and Gynecology

## 2024-02-10 VITALS — BP 123/80 | HR 88 | Ht 61.0 in | Wt 99.0 lb

## 2024-02-10 DIAGNOSIS — N941 Unspecified dyspareunia: Secondary | ICD-10-CM

## 2024-02-10 DIAGNOSIS — Z975 Presence of (intrauterine) contraceptive device: Secondary | ICD-10-CM | POA: Diagnosis not present

## 2024-02-10 DIAGNOSIS — N921 Excessive and frequent menstruation with irregular cycle: Secondary | ICD-10-CM | POA: Diagnosis not present

## 2024-02-10 MED ORDER — PREMARIN 0.625 MG/GM VA CREA: 1.0000 | TOPICAL_CREAM | Freq: Every day | VAGINAL | 6 refills | Status: AC

## 2024-02-10 NOTE — Progress Notes (Signed)
GYNECOLOGY VISIT  Patient name: Colleen Gordon MRN [id number]  Date of birth: [birth year] Chief Complaint:   Gynecologic Exam (Patient complaining of painful intercourse and after intercourse)   History:  Colleen Gordon is a 23 y.o. G0P0 being seen today for dyspareunia.   Discussed the use of AI scribe software for clinical note transcription with the patient, who gave verbal consent to proceed.  History of Present Illness   Colleen Gordon is a 23 year old female who presents with vaginal dryness and pain during intercourse following a Nexplanon switch.  She experiences vaginal dryness and pain during and after intercourse since switching her Nexplanon implant. The pain is described as achy with a burning sensation, often referred to as a 'dry sensation'. This discomfort began around the time her previous Nexplanon was expiring and the new one was inserted. The achiness typically lasts about 30 minutes after intercourse and then dissipates, localized towards the bottom of the vagina, near the site of insertion. No abnormal vaginal discharge, bleeding after intercourse, hot flashes, breast or nipple changes, changes in bowel habits, or new stressors. Occasionally, there is a sensation of dryness even when not engaged in intercourse. Burning with urination occurs only after sex.  She has not had her period for a few months since the Nexplanon switch. Previously, she was using Sprintec, which she stopped around October or November after experiencing bleeding. Since then, she has not had any bleeding, including after intercourse.  She recently started taking Pristiq and guanfacine. No new stressors or changes in health status reported.         Past Medical History:  Diagnosis Date   Allergy    Headache(784.0)    Pneumonia 08/2004   RLL bronchopneumonia    No past surgical history on file.  The following portions of the patient's history were reviewed and updated as appropriate: allergies,  current medications, past family history, past medical history, past social history, past surgical history and problem list.   Health Maintenance:   Last pap     Component Value Date/Time   DIAGPAP  10/21/2023 1100    - Negative for intraepithelial lesion or malignancy (NILM)   ADEQPAP  10/21/2023 1100    Satisfactory for evaluation; transformation zone component ABSENT.    High Risk HPV: Positive  Adequacy:  Satisfactory for evaluation, transformation zone component PRESENT  Diagnosis:  Atypical squamous cells of undetermined significance (ASC-US)   Review of Systems:  Pertinent items are noted in HPI. Comprehensive review of systems was otherwise negative.   Objective:  Physical Exam BP 123/80 (BP Location: Left Arm, Patient Position: Sitting, Cuff Size: Normal)   Pulse 88   Ht 5\' 1"  (1.549 m)   Wt 99 lb (44.9 kg)   LMP 11/04/2023 (Approximate)   BMI 18.71 kg/m    Physical Exam Vitals and nursing note reviewed. Exam conducted with a chaperone present.  Constitutional:      Appearance: Normal appearance.  HENT:     Head: Normocephalic and atraumatic.  Pulmonary:     Effort: Pulmonary effort is normal.     Breath sounds: Normal breath sounds.  Genitourinary:    General: Normal vulva.     Exam position: Lithotomy position.     Vagina: Normal.     Cervix: Normal.     Comments: Allodynia at posterior fourchette Nontender pelvic floor muscles Skin:    General: Skin is warm and dry.  Neurological:     General: No focal deficit present.  Mental Status: She is alert.  Psychiatric:        Mood and Affect: Mood normal.        Behavior: Behavior normal.        Thought Content: Thought content normal.        Judgment: Judgment normal.         Assessment & Plan:   Assessment and Plan    Post-coital dyspareunia and vaginal dryness Onset after Nexplanon insertion, with no abnormal vaginal discharge, bleeding, or systemic symptoms. Normal pelvic exam. -Order  labs to check estrogen and thyroid levels. -Prescribe vaginal estrogen cream: apply nightly for 2 weeks, then 2 nights per week thereafter. -Follow-up in 2-3 months or sooner if symptoms worsen or new symptoms develop. -Could be due to recently started medications        Routine preventative health maintenance measures emphasized.  Lorriane Shire, MD Minimally Invasive Gynecologic Surgery Center for [facility], [facility]

## 2024-02-13 ENCOUNTER — Encounter: Payer: Self-pay | Admitting: Obstetrics and Gynecology

## 2024-02-14 DIAGNOSIS — F909 Attention-deficit hyperactivity disorder, unspecified type: Secondary | ICD-10-CM | POA: Diagnosis not present

## 2024-02-14 DIAGNOSIS — F411 Generalized anxiety disorder: Secondary | ICD-10-CM | POA: Diagnosis not present

## 2024-02-17 ENCOUNTER — Other Ambulatory Visit: Payer: Medicaid Other

## 2024-02-17 DIAGNOSIS — E282 Polycystic ovarian syndrome: Secondary | ICD-10-CM | POA: Diagnosis not present

## 2024-02-17 DIAGNOSIS — N898 Other specified noninflammatory disorders of vagina: Secondary | ICD-10-CM

## 2024-02-17 NOTE — Progress Notes (Signed)
Patient presents for Estrogen and TSH labs. Patient was sent to the lab to have labs drawn. Hisao Doo l Shawnte Winton, CMA

## 2024-02-21 LAB — THYROID PANEL WITH TSH
Free Thyroxine Index: 1.8 (ref 1.2–4.9)
T3 Uptake Ratio: 29 % (ref 24–39)
T4, Total: 6.2 ug/dL (ref 4.5–12.0)
TSH: 1.13 u[IU]/mL (ref 0.450–4.500)

## 2024-02-21 LAB — ESTROGENS, TOTAL: Estrogen: 174 pg/mL

## 2024-02-22 ENCOUNTER — Encounter: Payer: Self-pay | Admitting: Obstetrics and Gynecology

## 2024-02-23 ENCOUNTER — Other Ambulatory Visit: Payer: Self-pay | Admitting: Obstetrics and Gynecology

## 2024-02-23 DIAGNOSIS — N921 Excessive and frequent menstruation with irregular cycle: Secondary | ICD-10-CM

## 2024-02-25 ENCOUNTER — Other Ambulatory Visit: Payer: Self-pay | Admitting: Nurse Practitioner

## 2024-02-25 DIAGNOSIS — R0981 Nasal congestion: Secondary | ICD-10-CM

## 2024-03-15 DIAGNOSIS — F900 Attention-deficit hyperactivity disorder, predominantly inattentive type: Secondary | ICD-10-CM | POA: Diagnosis not present

## 2024-03-15 DIAGNOSIS — F411 Generalized anxiety disorder: Secondary | ICD-10-CM | POA: Diagnosis not present

## 2024-03-27 ENCOUNTER — Telehealth: Payer: Self-pay

## 2024-03-27 NOTE — Telephone Encounter (Signed)
 Patient returned call and scheduled virtual with Dr. Briscoe Deutscher.

## 2024-03-27 NOTE — Telephone Encounter (Signed)
 Left voicemail for patient to call back and schedule her 3 month virtual follow up with Dr. Briscoe Deutscher.

## 2024-04-27 ENCOUNTER — Telehealth: Admitting: Obstetrics and Gynecology

## 2024-04-27 DIAGNOSIS — N941 Unspecified dyspareunia: Secondary | ICD-10-CM | POA: Diagnosis not present

## 2024-04-27 NOTE — Progress Notes (Signed)
    GYNECOLOGY VIRTUAL VISIT ENCOUNTER NOTE  Provider location: Center for [facility]   Patient location: Home  I connected with Colleen Gordon on 04/27/24 at  9:55 AM EDT by MyChart Video Encounter and verified that I am speaking with the correct person using two identifiers.   I discussed the limitations, risks, security and privacy concerns of performing an evaluation and management service virtually and the availability of in person appointments. I also discussed with the patient that there may be a patient responsible charge related to this service. The patient expressed understanding and agreed to proceed.   History:  Colleen Gordon is a 23 y.o. G0P0 female being evaluated today for dyspareunia . Has been better with the vaginal estrogen; noticed a difference after her menses and everything has been fine. Less consistent - a month since last use and has been able to have intercourse without any pain.  No other issues   Past Medical History:  Diagnosis Date   Allergy    Headache(784.0)    Pneumonia 08/2004   RLL bronchopneumonia   No past surgical history on file. The following portions of the patient's history were reviewed and updated as appropriate: allergies, current medications, past family history, past medical history, past social history, past surgical history and problem list.   Health Maintenance:      Component Value Date/Time   DIAGPAP  10/21/2023 1100    - Negative for intraepithelial lesion or malignancy (NILM)   ADEQPAP  10/21/2023 1100    Satisfactory for evaluation; transformation zone component ABSENT.    High Risk HPV: Positive  Adequacy:  Satisfactory for evaluation, transformation zone component PRESENT  Diagnosis:  Atypical squamous cells of undetermined significance (ASC-US )   Review of Systems:  Pertinent items noted in HPI and remainder of comprehensive ROS otherwise negative.  Physical Exam:   General:  Alert,  oriented and cooperative. Patient appears to be in no acute distress.  Mental Status: Normal mood and affect. Normal behavior. Normal judgment and thought content.   Respiratory: Normal respiratory effort, no problems with respiration noted  Rest of physical exam deferred due to type of encounter  Labs and Imaging No results found for this or any previous visit (from the past 2 weeks). No results found.     Assessment and Plan:     1. Dyspareunia in female (Primary) Resolved with initial use of vaginal estrogen and onset of menses.  Has been 1 month since last use and has continued to have no issues, recommend continue without and observe if there is return of symptoms.  If dyspareunia returns, recommend resumption of vaginal estrogen.      I discussed the assessment and treatment plan with the patient. The patient was provided an opportunity to ask questions and all were answered. The patient agreed with the plan and demonstrated an understanding of the instructions.   The patient was advised to call back or seek an in-person evaluation/go to the ED if the symptoms worsen or if the condition fails to improve as anticipated.  I provided 5 minutes of face-to-face time during this encounter. I also spent 5 minutes dedicated to the care of this patient including pre-visit review of records, post visit ordering of medications and appropriate tests or procedures, coordinating care and documenting this visit encounter.    Kiki Pelton, MD Center for [employer], [facility]

## 2024-06-22 ENCOUNTER — Other Ambulatory Visit (HOSPITAL_COMMUNITY): Payer: Self-pay

## 2024-07-04 ENCOUNTER — Other Ambulatory Visit: Payer: Self-pay | Admitting: Nurse Practitioner

## 2024-07-04 DIAGNOSIS — L700 Acne vulgaris: Secondary | ICD-10-CM

## 2024-07-10 ENCOUNTER — Other Ambulatory Visit (HOSPITAL_COMMUNITY): Payer: Self-pay

## 2024-07-10 ENCOUNTER — Other Ambulatory Visit (HOSPITAL_BASED_OUTPATIENT_CLINIC_OR_DEPARTMENT_OTHER): Payer: Self-pay

## 2024-07-17 ENCOUNTER — Other Ambulatory Visit: Payer: Self-pay

## 2024-07-17 DIAGNOSIS — F1290 Cannabis use, unspecified, uncomplicated: Secondary | ICD-10-CM | POA: Diagnosis not present

## 2024-07-17 DIAGNOSIS — F900 Attention-deficit hyperactivity disorder, predominantly inattentive type: Secondary | ICD-10-CM | POA: Diagnosis not present

## 2024-07-17 DIAGNOSIS — F411 Generalized anxiety disorder: Secondary | ICD-10-CM | POA: Diagnosis not present

## 2024-07-17 MED ORDER — JORNAY PM 80 MG PO CP24
80.0000 mg | ORAL_CAPSULE | Freq: Every day | ORAL | 0 refills | Status: AC
  Filled 2024-09-04 – 2024-09-13 (×2): qty 30, 30d supply, fill #0

## 2024-07-17 MED ORDER — DESVENLAFAXINE SUCCINATE ER 50 MG PO TB24
50.0000 mg | ORAL_TABLET | Freq: Every morning | ORAL | 0 refills | Status: DC
  Filled 2024-07-17 – 2024-08-01 (×2): qty 90, 90d supply, fill #0

## 2024-07-17 MED ORDER — JORNAY PM 80 MG PO CP24
50.0000 mg | ORAL_CAPSULE | Freq: Every day | ORAL | 0 refills | Status: AC
  Filled 2024-08-02: qty 30, 30d supply, fill #0

## 2024-07-27 ENCOUNTER — Other Ambulatory Visit (HOSPITAL_BASED_OUTPATIENT_CLINIC_OR_DEPARTMENT_OTHER): Payer: Self-pay

## 2024-08-01 ENCOUNTER — Other Ambulatory Visit (HOSPITAL_BASED_OUTPATIENT_CLINIC_OR_DEPARTMENT_OTHER): Payer: Self-pay

## 2024-08-01 ENCOUNTER — Other Ambulatory Visit: Payer: Self-pay

## 2024-08-01 ENCOUNTER — Other Ambulatory Visit (HOSPITAL_COMMUNITY): Payer: Self-pay

## 2024-08-02 ENCOUNTER — Other Ambulatory Visit: Payer: Self-pay

## 2024-08-03 ENCOUNTER — Other Ambulatory Visit: Payer: Self-pay

## 2024-09-04 ENCOUNTER — Other Ambulatory Visit (HOSPITAL_COMMUNITY): Payer: Self-pay

## 2024-09-04 ENCOUNTER — Other Ambulatory Visit: Payer: Self-pay

## 2024-09-11 ENCOUNTER — Other Ambulatory Visit (HOSPITAL_BASED_OUTPATIENT_CLINIC_OR_DEPARTMENT_OTHER): Payer: Self-pay

## 2024-09-11 MED ORDER — FLUZONE 0.5 ML IM SUSY
0.5000 mL | PREFILLED_SYRINGE | Freq: Once | INTRAMUSCULAR | 0 refills | Status: AC
  Filled 2024-09-11: qty 0.5, 1d supply, fill #0

## 2024-09-12 ENCOUNTER — Other Ambulatory Visit: Payer: Self-pay

## 2024-09-12 DIAGNOSIS — F411 Generalized anxiety disorder: Secondary | ICD-10-CM | POA: Diagnosis not present

## 2024-09-13 ENCOUNTER — Other Ambulatory Visit: Payer: Self-pay

## 2024-09-17 DIAGNOSIS — F411 Generalized anxiety disorder: Secondary | ICD-10-CM | POA: Diagnosis not present

## 2024-09-18 ENCOUNTER — Other Ambulatory Visit (HOSPITAL_BASED_OUTPATIENT_CLINIC_OR_DEPARTMENT_OTHER): Payer: Self-pay

## 2024-09-18 DIAGNOSIS — F900 Attention-deficit hyperactivity disorder, predominantly inattentive type: Secondary | ICD-10-CM | POA: Diagnosis not present

## 2024-09-18 DIAGNOSIS — F1290 Cannabis use, unspecified, uncomplicated: Secondary | ICD-10-CM | POA: Diagnosis not present

## 2024-09-18 DIAGNOSIS — F411 Generalized anxiety disorder: Secondary | ICD-10-CM | POA: Diagnosis not present

## 2024-09-18 MED ORDER — JORNAY PM 80 MG PO CP24
80.0000 mg | ORAL_CAPSULE | Freq: Every morning | ORAL | 0 refills | Status: AC
  Filled 2024-10-15: qty 30, 30d supply, fill #0

## 2024-09-19 ENCOUNTER — Other Ambulatory Visit: Payer: Self-pay

## 2024-09-26 ENCOUNTER — Other Ambulatory Visit (HOSPITAL_COMMUNITY): Payer: Self-pay

## 2024-09-26 ENCOUNTER — Other Ambulatory Visit: Payer: Self-pay

## 2024-10-04 ENCOUNTER — Ambulatory Visit (INDEPENDENT_AMBULATORY_CARE_PROVIDER_SITE_OTHER): Payer: Self-pay | Admitting: Nurse Practitioner

## 2024-10-04 ENCOUNTER — Encounter: Payer: Self-pay | Admitting: Nurse Practitioner

## 2024-10-04 VITALS — BP 100/67 | HR 65 | Resp 16 | Ht 61.0 in | Wt 115.6 lb

## 2024-10-04 DIAGNOSIS — Z0000 Encounter for general adult medical examination without abnormal findings: Secondary | ICD-10-CM

## 2024-10-04 DIAGNOSIS — Z13220 Encounter for screening for lipoid disorders: Secondary | ICD-10-CM

## 2024-10-04 DIAGNOSIS — Z1329 Encounter for screening for other suspected endocrine disorder: Secondary | ICD-10-CM | POA: Diagnosis not present

## 2024-10-04 DIAGNOSIS — E282 Polycystic ovarian syndrome: Secondary | ICD-10-CM | POA: Diagnosis not present

## 2024-10-04 DIAGNOSIS — E559 Vitamin D deficiency, unspecified: Secondary | ICD-10-CM

## 2024-10-04 MED ORDER — CLINDAMYCIN PHOS-BENZOYL PEROX 1.2-5 % EX GEL: 1.0000 | Freq: Every evening | CUTANEOUS | 0 refills | Status: AC | PRN

## 2024-10-04 NOTE — Progress Notes (Signed)
 Subjective   Patient ID: Colleen Gordon, female    DOB: 2001/01/07, 23 y.o.   MRN: [id number]  Chief Complaint  Patient presents with   Annual Exam   Acne    Pt is requesting a topical until she gets in with a dermatologist     Referring provider: Oley Bascom RAMAN, NP  Colleen Gordon is a 23 y.o. female with Past Medical History: No date: Allergy No date: Headache(784.0) 08/2004: Pneumonia     Comment:  RLL bronchopneumonia   HPI  Patient presents today for physical exam.  Overall she has been doing well since her last visit here.  She does have an upcoming appointment with dermatology but would like to get a topical gel until she can see the dermatologist.  We will order clindamycin .  Patient has used this in the past. Denies f/c/s, n/v/d, hemoptysis, PND, leg swelling Denies chest pain or edema     No Known Allergies  Immunization History  Administered Date(s) Administered   DTaP 08/14/2001, 10/17/2001, 01/01/2002, 10/12/2002, 06/07/2006   HIB (PRP-OMP) 08/14/2001, 10/17/2001, 01/01/2002, 10/12/2002   HPV 9-valent 10/03/2020, 12/12/2020, 07/10/2021   Hepatitis A 06/06/2007, 09/13/2008   Hepatitis B March 06, 2001, 08/14/2001, 04/04/2002   IPV 08/14/2001, 10/17/2001, 04/04/2002, 06/07/2006   Influenza Nasal 10/14/2009, 09/28/2011, 10/19/2012   Influenza, Seasonal, Injecte, Preservative Fre 09/30/2023, 09/11/2024   Influenza,Quad,Nasal, Live 10/17/2013, 10/29/2014   Influenza,inj,Quad PF,6+ Mos 08/14/2018, 08/16/2019, 07/30/2020   MMR 07/12/2002, 06/07/2006   Meningococcal B, OMV 08/16/2019, 10/03/2020   Meningococcal Conjugate 06/03/2014, 08/10/2017   Pneumococcal Conjugate-13 08/14/2001, 10/17/2001, 04/04/2002, 10/12/2002   Tdap 11/15/2011   Varicella 07/12/2002, 06/07/2006    Tobacco History: Social History   Tobacco Use  Smoking Status Never   Passive exposure: Yes  Smokeless Tobacco Never   Counseling given: Not Answered   Outpatient Encounter Medications as  of 10/04/2024  Medication Sig   celecoxib  (CELEBREX ) 200 MG capsule TAKE 1 CAPSULE(200 MG) BY MOUTH DAILY FOR 7 DAYS   cetirizine  (ZYRTEC ) 10 MG tablet Take 1 tablet (10 mg total) by mouth daily.   Clindamycin -Benzoyl Per, Refr, gel Apply 1 Application topically at bedtime as needed.   desvenlafaxine  (PRISTIQ ) 50 MG 24 hr tablet Take 50 mg by mouth daily.   desvenlafaxine  (PRISTIQ ) 50 MG 24 hr tablet Take 1 tablet (50 mg total) by mouth every morning.   etonogestrel  (NEXPLANON ) 68 MG IMPL implant 1 each by Subdermal route once.   fluticasone  (FLONASE ) 50 MCG/ACT nasal spray SHAKE LIQUID AND USE 2 SPRAYS IN EACH NOSTRIL DAILY   Methylphenidate  HCl ER, PM, (JORNAY PM ) 80 MG CP24 Take 1 capsule (80 mg total) by mouth at bedtime. usually at bedtim   Methylphenidate  HCl ER, PM, (JORNAY PM ) 80 MG CP24 Take 1 capsule by mouth daily at bedtime for focus and attention (8-9 hours before wakeup time)   [START ON 10/12/2024] Methylphenidate  HCl ER, PM, (JORNAY PM ) 80 MG CP24 Take 1 capsule (80 mg total) by mouth every morning.   brompheniramine-pseudoephedrine-DM 30-2-10 MG/5ML syrup Take 5 mLs by mouth 4 (four) times daily as needed. (Patient not taking: Reported on 10/04/2024)   conjugated estrogens  (PREMARIN ) vaginal cream Place 1 Applicatorful vaginally daily. Nightly for 2 weeks and then 2 week thereafter (Patient not taking: Reported on 10/04/2024)   DULoxetine  (CYMBALTA ) 20 MG capsule Take 1 capsule (20 mg total) by mouth daily. (Patient not taking: Reported on 10/04/2024)   guanFACINE (TENEX) 1 MG tablet Take 1 mg by mouth at bedtime. (Patient not taking: Reported  on 10/04/2024)   lisdexamfetamine (VYVANSE ) 20 MG capsule Take 1 capsule (20 mg total) by mouth daily. (Patient not taking: Reported on 10/04/2024)   norgestimate -ethinyl estradiol  (SPRINTEC 28) 0.25-35 MG-MCG tablet Take 1 tablet by mouth daily. (Patient not taking: Reported on 10/04/2024)   No facility-administered encounter medications on file  as of 10/04/2024.    Review of Systems  Review of Systems  Constitutional: Negative.   HENT: Negative.    Cardiovascular: Negative.   Gastrointestinal: Negative.   Allergic/Immunologic: Negative.   Neurological: Negative.   Psychiatric/Behavioral: Negative.       Objective:   BP 100/67   Pulse 65   Resp 16   Ht 5' 1 (1.549 m)   Wt 115 lb 9.6 oz (52.4 kg)   SpO2 96%   BMI 21.84 kg/m   Wt Readings from Last 5 Encounters:  10/04/24 115 lb 9.6 oz (52.4 kg)  02/10/24 99 lb (44.9 kg)  11/04/23 94 lb (42.6 kg)  10/21/23 95 lb (43.1 kg)  10/05/23 92 lb (41.7 kg)     Physical Exam Vitals and nursing note reviewed.  Constitutional:      General: She is not in acute distress.    Appearance: She is well-developed.  Cardiovascular:     Rate and Rhythm: Normal rate and regular rhythm.  Pulmonary:     Effort: Pulmonary effort is normal.     Breath sounds: Normal breath sounds.  Neurological:     Mental Status: She is alert and oriented to person, place, and time.       Assessment & Plan:   Lipid screening -     Lipid panel  Routine health maintenance -     Lipid panel -     CBC -     Comprehensive metabolic panel with GFR -     TSH -     VITAMIN D  25 Hydroxy (Vit-D Deficiency, Fractures)  Thyroid  disorder screen -     TSH  Vitamin D  deficiency -     VITAMIN D  25 Hydroxy (Vit-D Deficiency, Fractures)  PCOS (polycystic ovarian syndrome) -     Hemoglobin A1c  Other orders -     Clindamycin  Phos-Benzoyl Perox; Apply 1 Application topically at bedtime as needed.  Dispense: 45 g; Refill: 0     Return in about 1 year (around 10/04/2025) for Physical.   Bascom GORMAN Borer, NP 10/04/2024

## 2024-10-05 ENCOUNTER — Other Ambulatory Visit: Payer: Self-pay | Admitting: Medical Genetics

## 2024-10-05 DIAGNOSIS — Z006 Encounter for examination for normal comparison and control in clinical research program: Secondary | ICD-10-CM

## 2024-10-05 LAB — CBC
Hematocrit: 44.5 % (ref 34.0–46.6)
Hemoglobin: 14.6 g/dL (ref 11.1–15.9)
MCH: 30.6 pg (ref 26.6–33.0)
MCHC: 32.8 g/dL (ref 31.5–35.7)
MCV: 93 fL (ref 79–97)
Platelets: 244 x10E3/uL (ref 150–450)
RBC: 4.77 x10E6/uL (ref 3.77–5.28)
RDW: 11.7 % (ref 11.7–15.4)
WBC: 5.1 x10E3/uL (ref 3.4–10.8)

## 2024-10-05 LAB — COMPREHENSIVE METABOLIC PANEL WITH GFR
ALT: 25 IU/L (ref 0–32)
AST: 18 IU/L (ref 0–40)
Albumin: 4.5 g/dL (ref 4.0–5.0)
Alkaline Phosphatase: 82 IU/L (ref 41–116)
BUN/Creatinine Ratio: 21 (ref 9–23)
BUN: 15 mg/dL (ref 6–20)
Bilirubin Total: 0.4 mg/dL (ref 0.0–1.2)
CO2: 24 mmol/L (ref 20–29)
Calcium: 9.3 mg/dL (ref 8.7–10.2)
Chloride: 103 mmol/L (ref 96–106)
Creatinine, Ser: 0.72 mg/dL (ref 0.57–1.00)
Globulin, Total: 2 g/dL (ref 1.5–4.5)
Glucose: 88 mg/dL (ref 70–99)
Potassium: 4.9 mmol/L (ref 3.5–5.2)
Sodium: 140 mmol/L (ref 134–144)
Total Protein: 6.5 g/dL (ref 6.0–8.5)
eGFR: 120 mL/min/1.73 (ref >59)

## 2024-10-05 LAB — LIPID PANEL
Chol/HDL Ratio: 3.3 ratio (ref 0.0–4.4)
Cholesterol, Total: 187 mg/dL (ref 100–199)
HDL: 56 mg/dL (ref >39)
LDL Chol Calc (NIH): 121 mg/dL — ABNORMAL HIGH (ref 0–99)
Triglycerides: 51 mg/dL (ref 0–149)
VLDL Cholesterol Cal: 10 mg/dL (ref 5–40)

## 2024-10-05 LAB — TSH: TSH: 1.29 u[IU]/mL (ref 0.450–4.500)

## 2024-10-05 LAB — VITAMIN D 25 HYDROXY (VIT D DEFICIENCY, FRACTURES): Vit D, 25-Hydroxy: 9.3 ng/mL — ABNORMAL LOW (ref 30.0–100.0)

## 2024-10-05 LAB — HEMOGLOBIN A1C
Est. average glucose Bld gHb Est-mCnc: 103 mg/dL
Hgb A1c MFr Bld: 5.2 % (ref 4.8–5.6)

## 2024-10-08 ENCOUNTER — Other Ambulatory Visit (HOSPITAL_COMMUNITY): Payer: Self-pay

## 2024-10-11 ENCOUNTER — Other Ambulatory Visit: Payer: Self-pay

## 2024-10-11 ENCOUNTER — Other Ambulatory Visit (HOSPITAL_COMMUNITY): Payer: Self-pay

## 2024-10-11 ENCOUNTER — Ambulatory Visit: Payer: Self-pay | Admitting: Nurse Practitioner

## 2024-10-11 ENCOUNTER — Other Ambulatory Visit (HOSPITAL_BASED_OUTPATIENT_CLINIC_OR_DEPARTMENT_OTHER): Payer: Self-pay

## 2024-10-11 MED ORDER — VITAMIN D (ERGOCALCIFEROL) 1.25 MG (50000 UNIT) PO CAPS
50000.0000 [IU] | ORAL_CAPSULE | ORAL | 2 refills | Status: AC
  Filled 2024-10-11 (×2): qty 5, 35d supply, fill #0
  Filled 2024-11-12: qty 4, 28d supply, fill #1
  Filled 2024-12-10: qty 4, 28d supply, fill #2

## 2024-10-15 ENCOUNTER — Other Ambulatory Visit: Payer: Self-pay

## 2024-10-15 ENCOUNTER — Other Ambulatory Visit (HOSPITAL_BASED_OUTPATIENT_CLINIC_OR_DEPARTMENT_OTHER): Payer: Self-pay

## 2024-10-16 ENCOUNTER — Other Ambulatory Visit (HOSPITAL_BASED_OUTPATIENT_CLINIC_OR_DEPARTMENT_OTHER): Payer: Self-pay

## 2024-10-16 ENCOUNTER — Other Ambulatory Visit: Payer: Self-pay

## 2024-10-16 DIAGNOSIS — F1290 Cannabis use, unspecified, uncomplicated: Secondary | ICD-10-CM | POA: Diagnosis not present

## 2024-10-16 DIAGNOSIS — Z5181 Encounter for therapeutic drug level monitoring: Secondary | ICD-10-CM | POA: Diagnosis not present

## 2024-10-16 DIAGNOSIS — F900 Attention-deficit hyperactivity disorder, predominantly inattentive type: Secondary | ICD-10-CM | POA: Diagnosis not present

## 2024-10-16 DIAGNOSIS — F411 Generalized anxiety disorder: Secondary | ICD-10-CM | POA: Diagnosis not present

## 2024-10-16 MED ORDER — JORNAY PM 80 MG PO CP24
80.0000 mg | ORAL_CAPSULE | Freq: Every morning | ORAL | 0 refills | Status: AC
  Filled 2024-10-16 – 2024-12-10 (×2): qty 30, 30d supply, fill #0

## 2024-10-16 MED ORDER — DESVENLAFAXINE SUCCINATE ER 25 MG PO TB24
25.0000 mg | ORAL_TABLET | Freq: Every morning | ORAL | 0 refills | Status: DC
  Filled 2024-10-16: qty 30, 30d supply, fill #0

## 2024-10-16 MED ORDER — DESVENLAFAXINE SUCCINATE ER 50 MG PO TB24
50.0000 mg | ORAL_TABLET | Freq: Every morning | ORAL | 0 refills | Status: DC
  Filled 2024-10-16 (×2): qty 30, 30d supply, fill #0

## 2024-10-17 ENCOUNTER — Other Ambulatory Visit (HOSPITAL_COMMUNITY): Payer: Self-pay

## 2024-10-17 ENCOUNTER — Other Ambulatory Visit (HOSPITAL_BASED_OUTPATIENT_CLINIC_OR_DEPARTMENT_OTHER): Payer: Self-pay

## 2024-10-18 ENCOUNTER — Other Ambulatory Visit (HOSPITAL_COMMUNITY): Payer: Self-pay

## 2024-10-18 ENCOUNTER — Other Ambulatory Visit (HOSPITAL_BASED_OUTPATIENT_CLINIC_OR_DEPARTMENT_OTHER): Payer: Self-pay

## 2024-10-18 MED ORDER — FLUTICASONE PROPIONATE 50 MCG/ACT NA SUSP
2.0000 | Freq: Every day | NASAL | 2 refills | Status: AC
  Filled 2024-10-18: qty 16, 30d supply, fill #0
  Filled 2025-01-31: qty 16, 30d supply, fill #1

## 2024-11-03 ENCOUNTER — Other Ambulatory Visit (HOSPITAL_COMMUNITY): Payer: Self-pay

## 2024-11-12 ENCOUNTER — Other Ambulatory Visit (HOSPITAL_BASED_OUTPATIENT_CLINIC_OR_DEPARTMENT_OTHER): Payer: Self-pay

## 2024-11-12 DIAGNOSIS — F411 Generalized anxiety disorder: Secondary | ICD-10-CM | POA: Diagnosis not present

## 2024-11-12 DIAGNOSIS — F1290 Cannabis use, unspecified, uncomplicated: Secondary | ICD-10-CM | POA: Diagnosis not present

## 2024-11-12 DIAGNOSIS — F900 Attention-deficit hyperactivity disorder, predominantly inattentive type: Secondary | ICD-10-CM | POA: Diagnosis not present

## 2024-11-12 MED ORDER — DESVENLAFAXINE SUCCINATE ER 50 MG PO TB24
50.0000 mg | ORAL_TABLET | Freq: Every morning | ORAL | 1 refills | Status: AC
  Filled 2024-11-12 – 2024-12-10 (×2): qty 30, 30d supply, fill #0

## 2024-11-12 MED ORDER — JORNAY PM 80 MG PO CP24
80.0000 mg | ORAL_CAPSULE | Freq: Every morning | ORAL | 0 refills | Status: AC
  Filled 2025-01-25: qty 30, 30d supply, fill #0

## 2024-11-12 MED ORDER — JORNAY PM 80 MG PO CP24: 80.0000 mg | ORAL_CAPSULE | Freq: Every day | ORAL | 0 refills | Status: AC

## 2024-11-12 MED ORDER — DESVENLAFAXINE SUCCINATE ER 25 MG PO TB24
25.0000 mg | ORAL_TABLET | Freq: Every morning | ORAL | 1 refills | Status: AC
  Filled 2024-11-12 – 2025-01-25 (×3): qty 30, 30d supply, fill #0

## 2024-11-13 ENCOUNTER — Other Ambulatory Visit: Payer: Self-pay

## 2024-11-26 ENCOUNTER — Other Ambulatory Visit (HOSPITAL_BASED_OUTPATIENT_CLINIC_OR_DEPARTMENT_OTHER): Payer: Self-pay

## 2024-12-10 ENCOUNTER — Other Ambulatory Visit (HOSPITAL_BASED_OUTPATIENT_CLINIC_OR_DEPARTMENT_OTHER): Payer: Self-pay

## 2024-12-10 ENCOUNTER — Other Ambulatory Visit (HOSPITAL_COMMUNITY): Payer: Self-pay

## 2024-12-17 ENCOUNTER — Other Ambulatory Visit (HOSPITAL_BASED_OUTPATIENT_CLINIC_OR_DEPARTMENT_OTHER): Payer: Self-pay

## 2025-01-03 ENCOUNTER — Other Ambulatory Visit (HOSPITAL_BASED_OUTPATIENT_CLINIC_OR_DEPARTMENT_OTHER): Payer: Self-pay

## 2025-01-25 ENCOUNTER — Other Ambulatory Visit (HOSPITAL_COMMUNITY): Payer: Self-pay

## 2025-01-25 ENCOUNTER — Other Ambulatory Visit: Payer: Self-pay

## 2025-01-29 ENCOUNTER — Other Ambulatory Visit: Payer: Self-pay

## 2025-01-29 ENCOUNTER — Other Ambulatory Visit (HOSPITAL_COMMUNITY): Payer: Self-pay

## 2025-01-31 ENCOUNTER — Other Ambulatory Visit (HOSPITAL_COMMUNITY): Payer: Self-pay

## 2025-02-01 ENCOUNTER — Other Ambulatory Visit: Payer: Self-pay

## 2025-02-18 ENCOUNTER — Ambulatory Visit: Admitting: Physician Assistant

## 2025-07-31 ENCOUNTER — Encounter: Admitting: Nurse Practitioner
